# Patient Record
Sex: Female | Born: 1965 | Race: White | Marital: Married | Smoking: Never smoker
Health system: Northeastern US, Academic
[De-identification: ages and names within clinical notes are randomized; demographics above are authoritative.]

## PROBLEM LIST (undated history)

## (undated) DIAGNOSIS — R7989 Other specified abnormal findings of blood chemistry: Secondary | ICD-10-CM

## (undated) DIAGNOSIS — I7300 Raynaud's syndrome without gangrene: Secondary | ICD-10-CM

## (undated) DIAGNOSIS — J309 Allergic rhinitis, unspecified: Secondary | ICD-10-CM

## (undated) HISTORY — DX: Allergic rhinitis, unspecified: J30.9

## (undated) HISTORY — DX: Other specified abnormal findings of blood chemistry: R79.89

---

## 2007-10-07 DIAGNOSIS — R635 Abnormal weight gain: Secondary | ICD-10-CM | POA: Insufficient documentation

## 2007-10-07 DIAGNOSIS — J302 Other seasonal allergic rhinitis: Secondary | ICD-10-CM | POA: Insufficient documentation

## 2008-08-31 DIAGNOSIS — J309 Allergic rhinitis, unspecified: Secondary | ICD-10-CM | POA: Insufficient documentation

## 2008-09-21 DIAGNOSIS — J3089 Other allergic rhinitis: Secondary | ICD-10-CM | POA: Insufficient documentation

## 2009-06-02 ENCOUNTER — Ambulatory Visit
Admit: 2009-06-02 | Discharge: 2009-06-02 | Disposition: A | Discharge status: Home / Self Care | Payer: Self-pay | Source: Ambulatory Visit | Attending: Otolaryngology | Admitting: Otolaryngology

## 2009-06-16 ENCOUNTER — Ambulatory Visit
Admit: 2009-06-16 | Discharge: 2009-06-16 | Disposition: A | Discharge status: Home / Self Care | Payer: Self-pay | Source: Ambulatory Visit | Attending: Otolaryngology | Admitting: Otolaryngology

## 2009-06-28 ENCOUNTER — Ambulatory Visit: Payer: Self-pay | Admitting: Otolaryngology

## 2009-07-19 ENCOUNTER — Ambulatory Visit: Payer: Self-pay | Admitting: Otorhinolaryngology & Head-Neck

## 2009-08-09 ENCOUNTER — Ambulatory Visit: Payer: Self-pay

## 2009-08-23 ENCOUNTER — Ambulatory Visit: Payer: Self-pay

## 2009-09-16 ENCOUNTER — Ambulatory Visit: Payer: Self-pay

## 2009-09-27 ENCOUNTER — Ambulatory Visit: Payer: Self-pay

## 2009-10-11 ENCOUNTER — Ambulatory Visit: Payer: Self-pay

## 2009-10-25 ENCOUNTER — Ambulatory Visit: Payer: Self-pay

## 2009-11-08 ENCOUNTER — Encounter: Payer: Self-pay | Admitting: Gastroenterology

## 2009-11-08 ENCOUNTER — Ambulatory Visit: Payer: Self-pay

## 2009-11-29 ENCOUNTER — Ambulatory Visit: Payer: Self-pay

## 2009-12-27 ENCOUNTER — Ambulatory Visit: Payer: Self-pay

## 2010-01-09 ENCOUNTER — Ambulatory Visit: Payer: Self-pay

## 2010-01-24 ENCOUNTER — Ambulatory Visit: Payer: Self-pay

## 2010-02-07 ENCOUNTER — Ambulatory Visit: Payer: Self-pay

## 2010-02-28 ENCOUNTER — Ambulatory Visit: Payer: Self-pay

## 2010-03-21 ENCOUNTER — Ambulatory Visit: Payer: Self-pay

## 2010-03-21 LAB — HM MAMMOGRAPHY

## 2010-04-11 ENCOUNTER — Ambulatory Visit: Payer: Self-pay

## 2010-04-17 ENCOUNTER — Ambulatory Visit: Payer: Self-pay | Admitting: Primary Care

## 2010-04-17 NOTE — H&P (Addendum)
Reason For Visit   Diana Bell is a 44 year year old female here for a physical.  Patient is   currently taking allergy medications d/t seasonal allergies.  Medications   updated.  [email or web address].  Allergies   Latex-asked/denied  No Known Drug Allergy.  Current Meds   Nasonex 50 MCG/ACT Suspension;USE 2 SPRAYS IN EACH NOSTRIL ONCE DAILY; Rx  Singulair 10 MG Tablet;TAKE 1 TABLET DAILY.; Rx  Clarinex-D 24 Hour 5-240 MG Tablet Extended Release 24 Hour;TAKE 1 TABLET   DAILY.; Rx.  Active Problems   Mild Abnormal Weight Gain (783.1)  Allergic Rhinitis (477.9)  Allergic Rhinitis Due To Dust Mite (477.8)  Allergy To Cats (V15.09); MOLDS ALSO  Palpitations (785.1).  PMH   Entire omental allergies.  PSH   None.  Family Hx   Mother 57 years old, anxiety, HTN, carotid endarterectomy. Father 72 years   old glaucoma. Sister developmental disabled, seizure disorder, brother   arthritis.  Personal Hx   Married monogamous heterosexual no STD concerns.  No tobacco use.  One to 2   alcoholic beverages per week.  No illicit drug use.  Exercises regularly.    Just completed PhD studies.  Health Mgmt Plan   Mammogram every 1 year; for HEALTH MAINTENANCE.  Vital Signs   Recorded by Driscilla Grammes on 17 Apr 2010 02:10 PM  BP:114/72,  RUE,  Sitting,   HR: 77 b/min,   Height: 66 in, Weight: 163 lb, BMI: 26.3 kg/m2.  Physical Exam       REVIEW OF SYSTEMS:  General: No fatigue.  No significant weight change.  Head/ears/eyes/throat/nose: No hearing loss.  No vision change.  No oral   sores.  Respiratory: No shortness of breath or cough  Cardiovascular: No palpitations, angina or syncope  Gastrointestinal: No abdominal pain or bloating, change in stool color or   caliber; no hematemesis, hematochezia; no chronic diarrhea or constipation;   no dysphagia.  Genitourinary: No dysuria, frequency, hesitancy or nocturia  Endocrine: No polyuria, polydipsia, no thyroid disease.  Hematologic: No epistaxis, easy bruising or lymphadenopathy  Integumentary System: No  skin rashes or lesions  Neurologic: No progressive headache; no numbness or weakness to   extremities.  No history of seizures.  Infectious disease: No history of hepatitis, TB. Tested for TB yearly at   workplace with negative testing Has had  MMR and chicken pox  Musculoskeletal: No joint symptoms     OBJECTIVE:   GENERAL: Well-developed well-nourished in no distress.  SKIN: No rashes or abnormal lesions seen.  HEENT: PERRLA.  EOMI.  External auditory canals clear of cerumen.  Tympanic   membranes pearly gray, mobile without scarring or lesion.  Nares patent   bilaterally.  Oral mucosa moist without lesion.  Dentition unremarkable.    Pharynx without lesion.  NECK: Supple, no palpable lymphadenopathy, no thyromegaly.  No JVD, no   carotid bruits.  BREAST: No masses dimpling or discharge.  CHEST: Normal configuration.  LUNGS: Good air entry bilaterally, clear to auscultation bilaterally.  No   rales, rhonchi or wheeze.  HEART: Regular rate, normal S1, S2, no murmur.  ABDOMEN: No HSM, no masses, no hernias, nontender.  Abdominal aorta not   palpably enlarged.  The patient had normal active bowel sounds.  PELVIC: Deferred to GYN visit.  RECTAL: Deferred to GYN visit  EXTREMITIES: No edema, 2+ pedal pulses.  MUSCULOSKELETAL: Normal passive range of motion of shoulders, hips and   knees.  NEUROLOGIC: No tremor or strength deficit.  Sensory and motor  intact.    Cranial nerves normal.     ASSESSMENT/PLAN:  1. environmental allergies with seasonal flares.  Continue with year round   allergy injections as well as medications which have symptoms under   excellent control.  Patient Profile   No legal documents on file for health care management  RHIO and health care   proxy not in chart.  Native language is unknown  OTHER.  Native language   English.  No physical disability was observed.  No visual impairment.  No hearing loss.  Prev medicine administration of health risk questionnaire  ANNUAL RISK;    Counseling about  end-of-life issues  MOLST  Assessment   Discussed/Recommended:    Cardiovascular disease risk factors and risk factor modification.   Nutrition/weight management   Regular aerobic exercise   Monthly self breast exams and periodic mammograms   Annual gynecologic exams   Skin cancer screen/sun protection and avoidance   Colon cancer screening, recommendations for colonoscopy reviewed    Osteoporosis prevention, dietary calcium and vitamin D. requirements   reviewed.   Sexuality and STD risk factors reviewed   Safety and use of appropriate protective gear reviewed   Health-care proxy discussed.Encouraged patient to bring in updated form.   Age appropriate immunizations reviewed.  Signature   Electronically signed by: Erskine Speed  D.O.; 04/17/2010 4:54 PM EST.  Electronically signed by: Erskine Speed  D.O.; 04/17/2010 4:54 PM EST.

## 2010-04-19 ENCOUNTER — Ambulatory Visit
Admit: 2010-04-19 | Discharge: 2010-04-19 | Disposition: A | Discharge status: Home / Self Care | Payer: Self-pay | Source: Ambulatory Visit | Attending: Primary Care | Admitting: Primary Care

## 2010-04-19 LAB — CBC AND DIFFERENTIAL
Baso # K/uL: 0 THOU/uL (ref 0.0–0.1)
Basophil %: 0.2 % (ref 0.1–1.2)
Eos # K/uL: 0 THOU/uL (ref 0.0–0.4)
Eosinophil %: 0.6 % — ABNORMAL LOW (ref 0.7–5.8)
Hematocrit: 42 % (ref 34–45)
Hemoglobin: 13.4 g/dL (ref 11.2–15.7)
Lymph # K/uL: 1.9 THOU/uL (ref 1.2–3.7)
Lymphocyte %: 35.5 % (ref 19.3–51.7)
MCV: 87 fL (ref 79–95)
Mono # K/uL: 0.3 THOU/uL (ref 0.2–0.9)
Monocyte %: 6.3 % (ref 4.7–12.5)
Neut # K/uL: 3 THOU/uL (ref 1.6–6.1)
Platelets: 229 THOU/uL (ref 160–370)
RBC: 4.9 MIL/uL (ref 3.9–5.2)
RDW: 13 % (ref 11.7–14.4)
Seg Neut %: 57.4 % (ref 34.0–71.1)
WBC: 5.2 THOU/uL (ref 4.0–10.0)

## 2010-04-19 LAB — LIPID PANEL
Chol/HDL Ratio: 2.6
Cholesterol: 164 mg/dL
HDL: 64 mg/dL
LDL Calculated: 89 mg/dL
Non HDL Cholesterol: 100 mg/dL
Triglycerides: 57 mg/dL

## 2010-04-19 LAB — BASIC METABOLIC PANEL
Anion Gap: 8 (ref 7–16)
CO2: 28 mmol/L (ref 20–28)
Calcium: 9 mg/dL (ref 8.6–10.2)
Chloride: 101 mmol/L (ref 96–108)
Creatinine: 0.89 mg/dL (ref 0.51–0.95)
GFR,Black: >59 *
GFR,Caucasian: >59 *
Glucose: 80 mg/dL (ref 74–106)
Lab: 16 mg/dL (ref 6–20)
Potassium: 4.1 mmol/L (ref 3.3–5.1)
Sodium: 137 mmol/L (ref 133–145)

## 2010-04-19 LAB — TSH: TSH: 2.76 u[IU]/mL (ref 0.27–4.20)

## 2010-04-20 LAB — MUMPS ANTIBODY, IGG: Mumps IgG: IMMUNE

## 2010-04-20 LAB — RUBELLA ANTIBODY, IGG: Rubella IgG AB: IMMUNE

## 2010-04-20 LAB — MEASLES IGG AB: Measles IgG: IMMUNE

## 2010-04-21 LAB — VARICELLA ZOSTER IGG AB

## 2010-05-05 ENCOUNTER — Ambulatory Visit: Payer: Self-pay

## 2010-05-23 ENCOUNTER — Ambulatory Visit: Payer: Self-pay

## 2010-06-02 ENCOUNTER — Ambulatory Visit: Payer: Self-pay | Admitting: Otolaryngology

## 2010-06-02 NOTE — Progress Notes (Signed)
 Otolaryngology   Dear Dr. Erskine Speed:     We had the pleasure of seeing your patient, Diana Bell, in the   Otolaryngology  Clinic on June 02, 2010.  She is a 44 year old female   being seen in follow up for annual allergy evaluation.     Patient began allergy immunotherapy in September 2009 for perennial and   seasonal allergic rhinitis.  She has had a significant improvement in her   symptomatology and is very pleased.  She is obtaining injections every two   to 3 weeks.  She uses Clarinex D., Nasonex, and Singulair intermittently   during her pollen season.  Patient denies asthma or eczema.     MEDS: electronic medication record was reviewed and updated.  Pertinent   medications include Clarinex D., Nasonex, Singulair     On Exam patient is a healthy-appearing 44 year old female  EARS:external auditory canal patent bilaterally.  Both tympanic membranes   are intact and mobile.  NOSE: Erythematous mucous membranes, mild right deviated septum  ORAL: Posterior pharyngeal wall without inflammation  NECK: No adenopathy  LUNGS: Clear to auscultation.  SKIN: Without rash     My impression is that the patient has excellent control of her allergies on   allergy immunotherapy and supplemental medications.  We will continue with   the present regimen.  Follow up in 18 months for repeat examination.     Sincerely,     Diana Bell, M.D.  dictated by voice recognition software.  Signature   Electronically signed by: Salvadore Farber  MD Attend.; 06/02/2010 1:23 PM EST.

## 2010-06-13 ENCOUNTER — Ambulatory Visit: Payer: Self-pay

## 2010-07-04 ENCOUNTER — Ambulatory Visit: Payer: Self-pay

## 2010-07-25 ENCOUNTER — Ambulatory Visit: Payer: Self-pay

## 2010-07-25 ENCOUNTER — Ambulatory Visit
Admit: 2010-07-25 | Discharge: 2010-07-25 | Disposition: A | Discharge status: Home / Self Care | Payer: Self-pay | Source: Ambulatory Visit | Attending: Primary Care | Admitting: Primary Care

## 2010-07-25 LAB — BASIC METABOLIC PANEL
Anion Gap: 11 (ref 7–16)
CO2: 25 mmol/L (ref 20–28)
Calcium: 9 mg/dL (ref 8.6–10.2)
Chloride: 103 mmol/L (ref 96–108)
Creatinine: 0.85 mg/dL (ref 0.51–0.95)
GFR,Black: >59 *
GFR,Caucasian: >59 *
Glucose: 88 mg/dL (ref 74–106)
Lab: 18 mg/dL (ref 6–20)
Potassium: 4.2 mmol/L (ref 3.3–5.1)
Sodium: 139 mmol/L (ref 133–145)

## 2010-07-25 LAB — LIPID PANEL
Chol/HDL Ratio: 3.2
Cholesterol: 188 mg/dL
HDL: 58 mg/dL
LDL Calculated: 115 mg/dL
Non HDL Cholesterol: 130 mg/dL
Triglycerides: 74 mg/dL

## 2010-07-25 LAB — HEPATIC FUNCTION PANEL
ALT: 64 U/L — ABNORMAL HIGH (ref 0–35)
AST: 44 U/L — ABNORMAL HIGH (ref 0–35)
Albumin: 4.3 g/dL (ref 3.5–5.2)
Alk Phos: 65 U/L (ref 35–105)
Bilirubin,Direct: <0.2 mg/dL (ref 0.0–0.3)
Bilirubin,Total: 0.5 mg/dL (ref 0.0–1.2)
Total Protein: 6.7 g/dL (ref 6.3–7.7)

## 2010-07-26 LAB — HEMOGLOBIN A1C: Hemoglobin A1C: 5.3 % (ref 4.0–6.0)

## 2010-07-27 LAB — HEPATITIS A,B,C PROF
HBV Core Ab: NEGATIVE
HBV S Ab Quant: 22.8 IU/L
HBV S Ab: POSITIVE
HBV S Ag: NEGATIVE
Hep A Total Ab: NEGATIVE
Hep C Ab: NEGATIVE

## 2010-07-31 ENCOUNTER — Other Ambulatory Visit: Payer: Self-pay | Admitting: Primary Care

## 2010-08-16 ENCOUNTER — Ambulatory Visit: Payer: Self-pay | Admitting: Primary Care

## 2010-08-16 LAB — HEPATIC FUNCTION PANEL
ALT: 44 U/L — ABNORMAL HIGH (ref 0–35)
AST: 36 U/L — ABNORMAL HIGH (ref 0–35)
Albumin: 4.3 g/dL (ref 3.5–5.2)
Alk Phos: 65 U/L (ref 35–105)
Bilirubin,Direct: 0.1 mg/dL (ref 0.0–0.3)
Bilirubin,Total: 0.3 mg/dL (ref 0.0–1.2)
Total Protein: 6.9 g/dL (ref 6.3–7.7)

## 2010-08-16 LAB — FERRITIN: Ferritin: 5 ng/mL — ABNORMAL LOW (ref 10–120)

## 2010-08-16 NOTE — Progress Notes (Signed)
 Reason For Visit   F/u abdominal US  [email or web address].  HPI   Patient was noted to have elevated liver function tests on recent lab work.     She notes at the time she was using Advil and acetaminophen for aches and   pains.     Darl Pikes denies any knowledge of family history of liver disease.  She has no   personal history of hepatitis or yellow jaundice.     She also denied any alcohol intake other than one to 2 drinks per month.     She feels well and denies itching fatigue unusual bleeding or bruising.     We reviewed ultrasound results which were normal.  Allergies   Latex-asked/denied  No Known Drug Allergy.  Current Meds   Clarinex-D 24 Hour 5-240 MG Tablet Extended Release 24 Hour;TAKE 1 TABLET   DAILY.; Rx  Singulair 10 MG Tablet;TAKE 1 TABLET DAILY.; Rx  Nasonex 50 MCG/ACT Suspension;USE 2 SPRAYS IN EACH NOSTRIL ONCE DAILY; Rx.  Active Problems   Mild Abnormal Weight Gain (783.1)  Allergic Rhinitis (477.9)  Allergic Rhinitis Due To Dust Mite (477.8)  Allergy To Cats (V15.09); MOLDS ALSO  Palpitations (785.1).  Vital Signs   Recorded by MARTINEZ,JUDIE on 16 Aug 2010 07:44 AM  BP:118/78,  RUE,  Sitting,   HR: 60 b/min,  R Radial, Regular,   Height: 67 in, Weight: 160 lb, BMI: 25.1 kg/m2.  Physical Exam   Well-nourished well-developed no acute distress.  Eyes are anicteric.  Neck   is supple.  Lungs are clear without wheezing.  Heart regular rate and   rhythm no murmur.  Her abdomen is soft nontender with normoactive bowel   sounds.  No hepatosplenomegaly.  Extremities are negative for edema.     Assessment and plan 1.  Mild elevation of liver function tests of unclear   etiology.  Will repeat blood work checking autoimmune labs to include ANA,   ANCA, smooth muscle antibody, the f Actin antibody, and ferritin levels.     Followup in 3-6 months depending on results. Continue to avoid tylenol and   alcohol.  Signature   Electronically signed by: Erskine Speed  D.O.; 08/16/2010 4:45 PM EST.

## 2010-08-17 LAB — MITOCHONDRIAL ANTIBODIES, M2: Mitochondrial Ab: 8.1 U (ref 0.0–20.0)

## 2010-08-17 LAB — ANA TITER/PATTERN: ANA Titer: 80 — AB

## 2010-08-17 LAB — F-ACTIN ANTIBODY: F-Actin IgG: 9 U (ref 0–19)

## 2010-08-17 LAB — ANCA SCREEN: ANCA Screen: NEGATIVE

## 2010-08-17 LAB — ANTINUCLEAR ANTIBODY SCREEN: ANA Screen: POSITIVE — AB

## 2010-08-22 ENCOUNTER — Ambulatory Visit: Payer: Self-pay

## 2010-08-27 NOTE — Miscellaneous (Unsigned)
 Continuity of Care Record  Created: todo  From: [facility], SUSAN  From:   From: [employer], EHR v10.2.7.53  To: Diana Bell  Purpose: Patient Use;       Problems  Diagnosis: Mild Abnormal Weight Gain (783.1)   Diagnosis: Allergic Rhinitis (477.9)   Diagnosis: Allergic Rhinitis Due To Dust Mite (477.8)   Diagnosis: Allergy To Cats (V15.09)   Diagnosis: Palpitations (785.1)     Alerts  Allergy - Latex-asked/denied   Allergy - No Known Drug Allergy     Medications  Clarinex-D 24 Hour 5-240 MG Tablet Extended Release 24 Hour; TAKE 1 TABLET   DAILY. ; Rx   Nasonex 50 MCG/ACT Suspension; USE 2 SPRAYS IN EACH NOSTRIL ONCE DAILY ; Rx   Singulair 10 MG Tablet; TAKE 1 TABLET DAILY. ; Rx     Immunizations  * Varicella   Td   MMR   Varicella

## 2010-09-15 ENCOUNTER — Ambulatory Visit: Payer: Self-pay

## 2010-10-17 ENCOUNTER — Ambulatory Visit: Payer: Self-pay

## 2010-11-07 ENCOUNTER — Ambulatory Visit: Payer: Self-pay

## 2010-11-20 ENCOUNTER — Ambulatory Visit
Admit: 2010-11-20 | Discharge: 2010-11-20 | Disposition: A | Discharge status: Home / Self Care | Payer: Self-pay | Source: Ambulatory Visit | Attending: Primary Care | Admitting: Primary Care

## 2010-11-20 LAB — HEPATIC FUNCTION PANEL
ALT: 46 U/L — ABNORMAL HIGH (ref 0–35)
AST: 27 U/L (ref 0–35)
Albumin: 4.3 g/dL (ref 3.5–5.2)
Alk Phos: 67 U/L (ref 35–105)
Bilirubin,Direct: <0.2 mg/dL (ref 0.0–0.3)
Bilirubin,Total: 0.4 mg/dL (ref 0.0–1.2)
Total Protein: 6.7 g/dL (ref 6.3–7.7)

## 2010-12-01 ENCOUNTER — Ambulatory Visit: Payer: Self-pay

## 2010-12-26 ENCOUNTER — Ambulatory Visit: Payer: Self-pay

## 2011-01-19 ENCOUNTER — Ambulatory Visit: Payer: Self-pay

## 2011-01-19 NOTE — Progress Notes (Signed)
 Injection Date: 01/19/2011  Pre Assessment:no problems  Dose Given:0.15cc A right arm,  0.25cc B left arm  Post-Assessment:no problems  Comment:              Alben Spittle [facility]

## 2011-01-24 ENCOUNTER — Encounter: Payer: Self-pay | Admitting: Gastroenterology

## 2011-01-26 ENCOUNTER — Encounter: Payer: Self-pay | Admitting: Primary Care

## 2011-01-26 ENCOUNTER — Telehealth: Payer: Self-pay | Admitting: Primary Care

## 2011-01-26 DIAGNOSIS — R7989 Other specified abnormal findings of blood chemistry: Secondary | ICD-10-CM

## 2011-01-26 NOTE — Telephone Encounter (Signed)
Left message that labs have been ordered for her.

## 2011-01-26 NOTE — Telephone Encounter (Signed)
 Labs ordered per e message request

## 2011-01-29 ENCOUNTER — Ambulatory Visit
Admit: 2011-01-29 | Discharge: 2011-01-29 | Disposition: A | Discharge status: Home / Self Care | Payer: Self-pay | Source: Ambulatory Visit | Attending: Primary Care | Admitting: Primary Care

## 2011-01-29 DIAGNOSIS — R7989 Other specified abnormal findings of blood chemistry: Secondary | ICD-10-CM

## 2011-01-29 LAB — HEPATIC FUNCTION PANEL
ALT: 53 U/L — ABNORMAL HIGH (ref 0–35)
AST: 31 U/L (ref 0–35)
Albumin: 4.2 g/dL (ref 3.5–5.2)
Alk Phos: 70 U/L (ref 35–105)
Bilirubin,Direct: <0.2 mg/dL (ref 0.0–0.3)
Bilirubin,Total: 0.3 mg/dL (ref 0.0–1.2)
Total Protein: 6.7 g/dL (ref 6.3–7.7)

## 2011-01-30 LAB — ANTINUCLEAR ANTIBODY SCREEN: ANA Screen: POSITIVE — AB

## 2011-01-30 LAB — ANA TITER/PATTERN: ANA Titer: 80 — AB

## 2011-01-31 ENCOUNTER — Telehealth: Payer: Self-pay | Admitting: Primary Care

## 2011-01-31 NOTE — Telephone Encounter (Signed)
Left message to call on "other" number.  Home number was fast busy signal.

## 2011-01-31 NOTE — Telephone Encounter (Signed)
Message copied by Adonis Brook on Wed Jan 31, 2011 12:03 PM  ------       Message from: Collier Flowers       Created: Tue Jan 30, 2011  6:37 PM         Labs continued to show mild elevation in liver function test and ANA level.  Will discuss in further detail at followup next week

## 2011-02-01 NOTE — Telephone Encounter (Signed)
Left message to call office back

## 2011-02-05 ENCOUNTER — Ambulatory Visit: Payer: Self-pay | Admitting: Primary Care

## 2011-02-05 VITALS — BP 124/78 | HR 71 | Temp 98.6°F | Resp 17 | Ht 66.0 in | Wt 167.6 lb

## 2011-02-05 DIAGNOSIS — R945 Abnormal results of liver function studies: Secondary | ICD-10-CM

## 2011-02-05 DIAGNOSIS — R7989 Other specified abnormal findings of blood chemistry: Secondary | ICD-10-CM

## 2011-02-05 NOTE — Progress Notes (Signed)
Diana Bell reports feeling well.  She has no chest pain palpitation or shortness of breath.  Is here in followup of her elevated liver function tests.  She has very little alcohol intake one to 2 drinks per week.  She's limiting fat and cholesterol in her diet.  She also does not use Tylenol on a persistent basis.  Energy level is normal.  She has no abnormal bleeding or bruising.    There is no family or personal history of liver disease.  She has no risk factors such as blood transfusion or high-risk behavior for hepatitis.  There is no history of hepatitis.    On physical exam she is well-nourished well-developed no acute distress.  Eyes are anicteric.  Neck is supple.  Lungs clear to auscultation.  Heart regular rate and rhythm no murmur.  Abdomen soft nontender with normoactive bowel sounds.  Extremities are negative for edema.    Assessment and plan 1.  Mild elevation in liver function tests.  She was offered consultation with GI specialist but declines this.  2 continue with diet exercise lack of alcohol and acetaminophen and check liver function tests orderly for one year and then every 6 months if stable.

## 2011-02-20 ENCOUNTER — Ambulatory Visit: Payer: Self-pay

## 2011-02-20 NOTE — Progress Notes (Signed)
Injection Date: 02/20/2011  Pre Assessment:doing very well, not requiring any meds  Dose Given:0.25cc A right arm,  0.25cc B left arm  Post-Assessment:no problems  Comment:              Diana Bell Kayvon Mo

## 2011-03-16 ENCOUNTER — Ambulatory Visit: Payer: Self-pay

## 2011-03-16 NOTE — Progress Notes (Signed)
Injection Date: 03/16/2011  Pre Assessment: virtually no allergy symptoms  Dose Given: 0.25 cc A right upper arm; 0.25 cc B left upper arm  Post-Assessment: no reaction  Comment:              Diana Bell

## 2011-03-19 ENCOUNTER — Ambulatory Visit: Payer: Self-pay | Admitting: Physician Assistant

## 2011-03-19 MED ORDER — PREDNISONE 20 MG PO TABS *I*
ORAL_TABLET | ORAL | Status: DC
Start: 2011-03-19 — End: 2011-05-29

## 2011-03-19 NOTE — Progress Notes (Signed)
Patient is a 45 year old female reports yesterday which is complaining of rash on her bilateral shins.  She states has been present since Thursday.  She denies being in any wooded areas, denies any new soaps or detergents denies being bit by any insects, denies being any hot tubs or tanning booths.  She reports a very mild itch but does complain of a mild burning.  She has used hydrocortisone cream with no significant relief.  She denies any shortness of breath tongue swelling or trouble swallowing.  She does report she has been wearing more pants have been taken to the dry cleaners and believes this may have caused it    Physical exam  Dermatologic patient has erythematous papules on bilateral lower shins.  There is no streaking no pus no drainage no vesicles.    Assessment and plan :allergic dermatitis: start on prednisone and Zyrtec if no improvement by Friday we'll refer her to dermatology.  I did educate her on signs of cellulitis to look for she is to call if she gets any of these symptoms

## 2011-04-10 ENCOUNTER — Ambulatory Visit: Payer: Self-pay

## 2011-04-10 NOTE — Progress Notes (Signed)
ALLERGY SERUM ADMINISTRATION Date: 04/10/2011    Pre -Injection Assessment  allergy symptoms well controlled  Administration  Serum A 0.25 cc  proximal  right upper arm  Serum B 0.25cc left arm  Post-Injection Assessment  Serum A and  B  Without problems    Alben Spittle [facility]

## 2011-05-17 ENCOUNTER — Ambulatory Visit: Payer: Self-pay

## 2011-05-17 NOTE — Progress Notes (Signed)
ALLERGY SERUM ADMINISTRATION Date: 05/17/2011    Pre -Injection Assessment  allergy symptoms well controlled  Administration  Serum A 0.15cc  proximal  right upper arm  Serum B 0.15cc  proximal  left upper arm  Post-Injection Assessment  No problems    Alben Spittle [facility]

## 2011-05-29 ENCOUNTER — Ambulatory Visit: Payer: Self-pay | Admitting: Otolaryngology

## 2011-05-29 ENCOUNTER — Encounter: Payer: Self-pay | Admitting: Otolaryngology

## 2011-05-29 VITALS — BP 113/66 | HR 72 | Ht 67.0 in | Wt 171.2 lb

## 2011-05-29 DIAGNOSIS — J309 Allergic rhinitis, unspecified: Secondary | ICD-10-CM

## 2011-05-29 NOTE — Progress Notes (Signed)
DEPARTMENT OF OTOLARYNGOLOGY    Today's Date: 05/29/2011     Chief Complaint: Allergy recheck    HPI:  Diana Bell is a 45 y.o. year old female seen in follow up for perennial and seasonal allergic rhinitis.  Patient has been on allergy immunotherapy since the fall 2009.  Original symptoms included headache and facial pressure and occasional postnasal drainage.  Patient reports significant improvement in her symptomatology.  She uses Clarinex on an as-needed basis.  She is presently obtaining injections once a month with minimal symptomatology.  Even during her peak season in the spring she has minimal symptoms.                     ROS:  Denies asthma, eczema, food or chemical sensitivities    Allergies:   Environmental allergies    Active Medications:  Current Outpatient Prescriptions   Medication   . desloratadine-pseudoephedrine (CLARINEX-D 24 HOUR) 5-240 MG per 24 hr tablet       Electronic record reviewed for PMH, PSH    Physical Examination:  Healthy appearing 45 y.o. year old female  Blood pressure 113/66, pulse 72, height 1.702 m (5\' 7" ), weight 77.656 kg (171 lb 3.2 oz).  EARS:external auditory canal patent bilaterally.  Both tympanic membranes are intact and mobile.  NOSE: Right anterior deviated septum, mildly dry mucous membranes  ORAL: Posterior pharyngeal wall without abnormality  LARYNX: Not visualized  NECK: No adenopathy  SKIN: Without rash         Assessment:  45 year old female with perennial and seasonal allergic rhinitis.  Symptoms are well-controlled on allergy immunotherapy and Clarinex D. as needed.  Patient will continue with present regimen.  Followup in one year for routine check.        Maurilio Lovely, MD as of 8:35 AM, 05/29/2011

## 2011-06-05 ENCOUNTER — Ambulatory Visit: Payer: Self-pay

## 2011-06-05 NOTE — Progress Notes (Signed)
ALLERGY SERUM ADMINISTRATION Date: 06/05/2011    Pre -Injection Assessment  mild increase in symptoms this week  Administration  Serum A 0.25 cc  proximal  right upper arm  Serum B 0.25 cc  proximal  left upper arm  Post-Injection Assessment  No problems      Alben Spittle [facility]

## 2011-07-03 ENCOUNTER — Ambulatory Visit: Payer: Self-pay

## 2011-07-03 NOTE — Progress Notes (Signed)
ALLERGY SERUM ADMINISTRATION Date: 07/03/2011    Pre -Injection Assessment  allergy symptoms controlled with medication  Administration  Serum A 0.25 cc  proximal  right upper arm  Serum B 0.25 cc  proximal  left upper arm  Post-Injection Assessment  No problems      Alben Spittle [facility]

## 2011-07-26 ENCOUNTER — Ambulatory Visit: Payer: Self-pay

## 2011-08-01 ENCOUNTER — Ambulatory Visit
Admit: 2011-08-01 | Discharge: 2011-08-01 | Disposition: A | Discharge status: Home / Self Care | Payer: Self-pay | Source: Ambulatory Visit | Attending: Primary Care | Admitting: Primary Care

## 2011-08-01 DIAGNOSIS — R7989 Other specified abnormal findings of blood chemistry: Secondary | ICD-10-CM

## 2011-08-01 LAB — HEPATIC FUNCTION PANEL
ALT: 76 U/L — ABNORMAL HIGH (ref 0–35)
AST: 50 U/L — ABNORMAL HIGH (ref 0–35)
Albumin: 4.3 g/dL (ref 3.5–5.2)
Alk Phos: 68 U/L (ref 35–105)
Bilirubin,Direct: <0.2 mg/dL (ref 0.0–0.3)
Bilirubin,Total: 0.5 mg/dL (ref 0.0–1.2)
Total Protein: 6.7 g/dL (ref 6.3–7.7)

## 2011-08-03 ENCOUNTER — Encounter: Payer: Self-pay | Admitting: Primary Care

## 2011-08-06 ENCOUNTER — Ambulatory Visit: Payer: Self-pay | Admitting: Primary Care

## 2011-08-06 ENCOUNTER — Encounter: Payer: Self-pay | Admitting: Primary Care

## 2011-08-06 ENCOUNTER — Encounter: Payer: Self-pay | Admitting: Gastroenterology

## 2011-08-06 VITALS — BP 114/66 | HR 80 | Ht 67.0 in | Wt 171.0 lb

## 2011-08-06 DIAGNOSIS — R7989 Other specified abnormal findings of blood chemistry: Secondary | ICD-10-CM

## 2011-08-06 DIAGNOSIS — J309 Allergic rhinitis, unspecified: Secondary | ICD-10-CM

## 2011-08-17 ENCOUNTER — Ambulatory Visit: Payer: Self-pay

## 2011-09-07 ENCOUNTER — Ambulatory Visit: Payer: Self-pay

## 2011-09-12 ENCOUNTER — Encounter: Payer: Self-pay | Admitting: Radiology

## 2011-09-18 ENCOUNTER — Ambulatory Visit: Payer: Self-pay | Admitting: Gastroenterology

## 2011-10-18 ENCOUNTER — Ambulatory Visit: Payer: Self-pay

## 2011-10-18 NOTE — Progress Notes (Signed)
ALLERGY SERUM ADMINISTRATION Date: 10/18/2011    Pre -Injection Assessment  allergy symptoms controlled with medication  Administration  Serum A 0.15cc  proximal  right upper arm  Serum B 0.15cc  proximal  left upper arm  Post-Injection Assessment  New vials    Alben Spittle [facility]

## 2011-10-23 ENCOUNTER — Ambulatory Visit
Admit: 2011-10-23 | Discharge: 2011-10-23 | Disposition: A | Discharge status: Home / Self Care | Payer: Self-pay | Attending: Gastroenterology | Admitting: Gastroenterology

## 2011-10-23 ENCOUNTER — Encounter: Payer: Self-pay | Admitting: Gastroenterology

## 2011-10-23 VITALS — BP 107/47 | HR 64 | Resp 18 | Ht 67.0 in | Wt 177.4 lb

## 2011-10-23 DIAGNOSIS — R7989 Other specified abnormal findings of blood chemistry: Secondary | ICD-10-CM

## 2011-10-23 HISTORY — DX: Raynaud's syndrome without gangrene: I73.00

## 2011-10-23 NOTE — H&P (Addendum)
Lorilynn Cata  [id number]     10/23/2011  Collier Flowers, DO  [address]  [place], [place] [address]      We had the pleasure of seeing your patient, Diana Bell, in the outpatient gastroenterology/hepatology clinic. As you know, she is a 46 y.o. female with no significant past medical history who is referred to our office for further evaluation of abnormal LFTs. She believes she has Raynaud's disease.     Abnormal LFTs were initially detected in 2011. There are no prior labs for comparison. Since 2011, they have been persistently elevated, up to 2x the upper limit of normal. She denies any risk factors for viral hepatitis; Hepatitis A, B, and C were negative in December 2011. She has been vaccinated for hepatitis B. ANA was positive, 1:80. F-Actin, Mitochondrial Ab and ANCA are WNL. Abdominal ultrasound in December 2011 was unremarkable.     She denies any recent medication changes. She was previously taking OTC anti-histamines for seasonal allergies which she has been avoiding. She continues to receive allergy shots. She drinks 2 glasses of wine per week maximum. No history of recreational drug use.     Allergies/Sensitivities:  Allergies   Allergen Reactions    Environmental Allergies     No Known Drug Allergy     No Known Latex Allergy        Medications:   Medication Sig   Evening Primrose Oil CAPS Take by mouth daily     Allergy shots                                          Twice a month.    Past Medical Hx:   Past Medical History   Diagnosis Date    Allergic rhinitis     Elevated LFTs     Raynaud disease        Past Surgical Hx: History reviewed. No pertinent past surgical history.    Social Hx:   History   Substance Use Topics    Smoking status: Never Smoker     Smokeless tobacco: Never Used    Alcohol Use: 1.2 oz/week     2 Glasses of wine per week   She is a physical therapist.     Family Hx:   Family History   Problem Relation Age of Onset    Heart disease Mother     Hypertension Mother      Anesth problems Mother     Cancer Father     Colon cancer Paternal Grandmother      70's       ROS:   General:  No malaise, fatigue, fever, chills, sweats.  HEENT:  No tinnitus, coryza, diplopia, dysgeusia.  Cardiovascular:  No chest pain, palpitations.  Respiratory:  No dyspnea, cough.  Musculoskeletal:  No myalgias.  GI:  See above.  GU:  No dysuria, hematuria.  Skin:  No rash, pruritus, jaundice.  Neuro:   No focal numbness, weakness, tremor.  Psychiatric:  No somnolence, confusion, dysphoria.  Endocrine:  No heat or cold intolerance.  Heme/Lymph:  No easy bruising/bleeding.  No concerning lumps.  Allergy/Rheum:  No arthralgias/arthritis.  No rash/hives.     Vitals:   Filed Vitals:    10/23/11 0959   BP: 107/47   Pulse: 64   Resp: 18   Height: 170.2 cm (5\' 7" )   Weight: 80.468  kg (177 lb 6.4 oz)      Body mass index is 27.78 kg/(m^2).    Physical Exam:   General: Well-appearing female in NAD. Comfortable.   Skin:  No rashes, jaundice, spider angiomata, palmar erythema, or telangiectasias.  Warm and dry.   Lymphatics:  No peripheral adenopathy palpated in SCF or cervical chains.    HEENT:  NC/AT, no glossitis, cheilosis, or icterus.  No oropharyngeal abnormalities.    Neck:  No masses or tracheal deviation.  Supple.  Lungs:  Clear bilaterally to auscultation. Normal respiratory effort.    Cor:  RRR without murmur, rub, or gallop.  No heave or thrill.    Abdomen:  Normal bowel sounds, no bruits.  Non-tender, non-distended. No hepatosplenomegaly, masses, hernias, or fluid wave/shifting dullness.    Rectum: Deferred, as it was not pertinent to this evaluation.   Extremities:  Warm, normal pedal pulses, good capillary refill of nail beds, no edema.  Neuro:  Alert and oriented x 3 with appropriate affect.  Ambulatory.  No gross motor deficits noted.      Labs/Imaging:      Ref. Range 07/25/2010 08:10 08/16/2010 08:41 11/20/2010 08:50 01/29/2011 08:33 08/01/2011 08:20   ALT Latest Range: 0-35 U/L 64 (H) 44 (H) 46 (H)  53 (H) 76 (H)   AST Latest Range: 0-35 U/L 44 (H) 36 (H) 27 31 50 (H)   Alk Phos Latest Range: 35-105 U/L 65 65 67 70 68   Bilirubin,Direct Latest Range: 0.0-0.3 mg/dL < 0.2 0.1 < 0.2 < 0.2 < 0.2   Bilirubin,Total Latest Range: 0.0-1.2 mg/dL 0.5 0.3 0.4 0.3 0.5      08/16/2010 08:41 01/29/2011 08:33   ANA Screen POS (A) POS (A)   ANA Pattern NUCLEOLAR NUCLEOLAR   ANA Titer 80 (A) 80 (A)   ANCA Screen NEG    F-Actin IgG 9    Mitochondrial Ab 8.1       07/25/2010 08:10   Hep A Total Ab NEG   HBV S Ag NEG   HBV S Ab POS   HBV S Ab Quant 22.80   HBV Core Ab NEG   HBV Interp see below   Hep C Ab NEG       Impression(s)/Recommendation(s):   46 yo female with no significant PMH who presents for consultation regarding abnormal LFTs. This was initially detected in December 2011. We do not have LFTs prior to this. She denies any recent medication changes or viral illnesses. The LFTs are mildly elevated with no signs/symptoms of decompensated liver disease.    1. Recommend additional blood work including ferritin, iron, TIBC, transferrin, ceruloplasmin, celiac screen.     2. Avoid hepatotoxic medications.     3. Encouraged healthy lifestyle with regular aerobic exercise and low fat diet.     4. Routine labs every 3 months to monitor LFTs.     4. Follow-up in 6 months.     We thank you for allowing [place] to participate in this patient's care.  The patient was seen, evaluated and plan determined by Dr. Marlow Baars. Please do not hesistate to contact us with any questions or concerns at [phone].      SARAH Gwynneth Macleod, PA      Administrative addendum:  The MLP is functioning as a scribe in typing this note for me (Dr. Domingo Dimes). I personally saw and examined this patient, formulated the assessment, and directed the management.  I also reviewed and edited this note before signature  The tests done including Celiac panel show no obvious cause for her liver enzyme elevations.  Her iron is low and oral supplementation can be  considered especially as she donates blood "every 56 days"  Likely it is mild fatty liver - diet/weight loss by 5-10 lb may be all it takes to normalize the liver tests - repeat in 3 months    Marlow Baars, MD

## 2011-10-23 NOTE — Discharge Instructions (Signed)
Please have blood work collected to further evaluate your liver function.

## 2011-10-24 ENCOUNTER — Ambulatory Visit
Admit: 2011-10-24 | Discharge: 2011-10-24 | Disposition: A | Discharge status: Home / Self Care | Payer: Self-pay | Source: Ambulatory Visit | Attending: Gastroenterology | Admitting: Gastroenterology

## 2011-10-24 LAB — HEPATIC FUNCTION PANEL
ALT: 62 U/L — ABNORMAL HIGH (ref 0–35)
AST: 40 U/L — ABNORMAL HIGH (ref 0–35)
Albumin: 3.9 g/dL (ref 3.5–5.2)
Alk Phos: 71 U/L (ref 35–105)
Bilirubin,Direct: <0.2 mg/dL (ref 0.0–0.3)
Bilirubin,Total: 0.4 mg/dL (ref 0.0–1.2)
Total Protein: 6.3 g/dL (ref 6.3–7.7)

## 2011-10-24 LAB — FERRITIN: Ferritin: 15 ng/mL (ref 10–120)

## 2011-10-24 LAB — GGT: GGT: 24 U/L (ref 5–36)

## 2011-10-24 LAB — TIBC
Iron: 67 ug/dL (ref 34–165)
TIBC: 370 ug/dL (ref 250–450)
Transferrin Saturation: 18 % (ref 15–50)

## 2011-10-24 LAB — CERULOPLASMIN: Ceruloplasmin: 27 mg/dL (ref 16–45)

## 2011-10-24 LAB — TRANSFERRIN: Transferrin: 310 mg/dL (ref 200–360)

## 2011-10-24 LAB — ALPHA-1-ANTITRYPSIN: A-1 Antitrypsin: 120 mg/dL (ref 90–200)

## 2011-10-25 ENCOUNTER — Telehealth: Payer: Self-pay | Admitting: Gastroenterology

## 2011-10-25 LAB — TISSUE TRANSGLUT,IGA
IgA: 274 mg/dL (ref 70–400)
tTG,IgA: 6.8 AB/Units (ref 0.0–19.9)

## 2011-10-25 NOTE — Telephone Encounter (Signed)
Called patient and left a message    Celaic disease test was negative  Iron is low  But other tets Ceruloplasmin, alpha 1 antitrypsin etc are all in the nromal range    Diet and lose 10 Lb repeat labs in 3 months

## 2011-11-08 ENCOUNTER — Ambulatory Visit: Payer: Self-pay

## 2011-11-08 NOTE — Progress Notes (Signed)
ALLERGY SERUM ADMINISTRATION Date: 11/08/2011    Pre -Injection Assessment  allergy symptoms controlled with medication  Administration  Serum A 0.25 cc  proximal  right upper arm  Serum B 0.25 cc  proximal  left upper arm  Post-Injection Assessment  No problems    Alben Spittle [facility]

## 2011-11-27 ENCOUNTER — Ambulatory Visit: Payer: Self-pay

## 2011-11-27 NOTE — Progress Notes (Signed)
ALLERGY SERUM ADMINISTRATION Date: 11/27/2011    Pre -Injection Assessment  allergy symptoms controlled with medication  Administration  Serum A 0.25 cc  proximal  right upper arm  Serum B 0.25 cc  proximal  left upper arm  Post-Injection Assessment  No problem    Alben Spittle [facility]

## 2011-12-11 ENCOUNTER — Ambulatory Visit: Payer: Self-pay

## 2011-12-11 NOTE — Progress Notes (Signed)
ALLERGY SERUM ADMINISTRATION Date: 12/11/2011    Pre -Injection Assessment  allergy symptoms controlled with medication  Administration  Serum A 0.25 cc  proximal  right upper arm  Serum B 0.25 cc  proximal  left upper arm  Post-Injection Assessment  No problems    Alben Spittle [facility]

## 2011-12-25 ENCOUNTER — Ambulatory Visit: Payer: Self-pay

## 2011-12-25 NOTE — Progress Notes (Signed)
ALLERGY SERUM ADMINISTRATION Date: 12/25/2011    Pre -Injection Assessment  allergy symptoms well controlled  Administration  Serum A 0.25 cc  proximal  right upper arm  Serum B 0.25 cc  proximal  right upper arm  Post-Injection Assessment  No reaction    Diana Bell

## 2012-01-22 ENCOUNTER — Ambulatory Visit: Payer: Self-pay

## 2012-01-22 NOTE — Progress Notes (Signed)
ALLERGY SERUM ADMINISTRATION Date: 01/22/2012    Pre -Injection Assessment  allergy symptoms controlled with medication  Administration  Serum A 0.15cc  proximal  right upper arm  Serum B 0.15cc  proximal  left upper arm  Post-Injection Assessment  New vials    Alben Spittle Juanita Devincent

## 2012-02-04 ENCOUNTER — Ambulatory Visit: Payer: Self-pay | Admitting: Primary Care

## 2012-02-07 ENCOUNTER — Ambulatory Visit: Payer: Self-pay | Admitting: Primary Care

## 2012-02-08 ENCOUNTER — Telehealth: Payer: Self-pay | Admitting: Primary Care

## 2012-02-08 DIAGNOSIS — Z139 Encounter for screening, unspecified: Secondary | ICD-10-CM

## 2012-02-08 NOTE — Telephone Encounter (Signed)
Pt would like to have labs checked before her appointment next week. Please place in the system and I will call her to let her know this has been done.

## 2012-02-11 NOTE — Telephone Encounter (Signed)
Left message stating she can have labs done

## 2012-02-12 ENCOUNTER — Ambulatory Visit
Admit: 2012-02-12 | Discharge: 2012-02-12 | Disposition: A | Discharge status: Home / Self Care | Payer: Self-pay | Source: Ambulatory Visit | Attending: Primary Care | Admitting: Primary Care

## 2012-02-12 DIAGNOSIS — Z139 Encounter for screening, unspecified: Secondary | ICD-10-CM

## 2012-02-12 LAB — LIPID PANEL
Chol/HDL Ratio: 3.2
Cholesterol: 188 mg/dL
HDL: 59 mg/dL
LDL Calculated: 102 mg/dL
Non HDL Cholesterol: 129 mg/dL
Triglycerides: 134 mg/dL

## 2012-02-12 LAB — HEPATIC FUNCTION PANEL
ALT: 28 U/L (ref 0–35)
AST: 24 U/L (ref 0–35)
Albumin: 4.3 g/dL (ref 3.5–5.2)
Alk Phos: 70 U/L (ref 35–105)
Bilirubin,Direct: 0.1 mg/dL (ref 0.0–0.3)
Bilirubin,Total: 0.4 mg/dL (ref 0.0–1.2)
Total Protein: 6.5 g/dL (ref 6.3–7.7)

## 2012-02-12 LAB — BASIC METABOLIC PANEL
Anion Gap: 10 (ref 7–16)
CO2: 27 mmol/L (ref 20–28)
Calcium: 9.3 mg/dL (ref 8.6–10.2)
Chloride: 101 mmol/L (ref 96–108)
Creatinine: 0.86 mg/dL (ref 0.51–0.95)
GFR,Black: 93 *
GFR,Caucasian: 80 *
Glucose: 104 mg/dL — ABNORMAL HIGH (ref 60–99)
Lab: 18 mg/dL (ref 6–20)
Potassium: 4.3 mmol/L (ref 3.3–5.1)
Sodium: 138 mmol/L (ref 133–145)

## 2012-02-12 LAB — CBC
Hematocrit: 40 % (ref 34–45)
Hemoglobin: 13.3 g/dL (ref 11.2–15.7)
MCV: 86 fL (ref 79–95)
Platelets: 236 10*3/uL (ref 160–370)
RBC: 4.7 MIL/uL (ref 3.9–5.2)
RDW: 12.7 % (ref 11.7–14.4)
WBC: 6.1 10*3/uL (ref 4.0–10.0)

## 2012-02-13 ENCOUNTER — Telehealth: Payer: Self-pay | Admitting: Primary Care

## 2012-02-13 NOTE — Telephone Encounter (Signed)
Pt.notified

## 2012-02-13 NOTE — Telephone Encounter (Signed)
Message copied by Birdie Hopes on Wed Feb 13, 2012  8:26 AM  ------       Message from: Collier Flowers       Created: Tue Feb 12, 2012  4:56 PM         Mild glucose elevation       Other labs are normal  ------

## 2012-02-14 ENCOUNTER — Encounter: Payer: Self-pay | Admitting: Primary Care

## 2012-02-14 ENCOUNTER — Ambulatory Visit: Payer: Self-pay | Admitting: Primary Care

## 2012-02-14 VITALS — BP 112/80 | HR 78 | Ht 66.0 in | Wt 167.0 lb

## 2012-02-14 DIAGNOSIS — R7989 Other specified abnormal findings of blood chemistry: Secondary | ICD-10-CM

## 2012-02-14 NOTE — Progress Notes (Signed)
Diana Bell is here in follow up of elevated liver function tests, allergies.  She's been exercising regularly running 3-4 days per week a minimum of 3 miles.  She has no chest pain palpitation shortness of breath PND or orthopnea.  She's been able to lose some weight and generally feels very well.    Allergy injections also working for symptoms.    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  This will be a full-time position.      Filed Vitals:    02/14/12 0801   BP: 112/80   Pulse: 78   Height: 1.676 m (5\' 6" )   Weight: 75.751 kg (167 lb)       Medication list reviewed and updated at appointment.  Changes were made    Current Outpatient Prescriptions on File Prior to Visit   Medication Sig Dispense Refill   . Evening Primrose Oil CAPS Take by mouth daily           No current facility-administered medications on file prior to visit.       GENERAL:  Alert, no distress.  Well-nourished and well-developed  HEENT:  NCAT, PERRLA, EOMI.  Anicteric  NECK:  Supple without adenopathy.  No JVD or thyromegaly   CARDIAC:  RRR, S1 S2, no murmur  LUNGS:  Clear to auscultation bilaterally  ABDOMEN:  Soft, nontender, nondistended with positive bowel sounds, no masses or organomegaly  SKIN:  warm and dry, no rashes  EXTREMITIES: No clubbing cyanosis or edema  NEURO: Nonfocal exam, normal gait, No tremor     Assessment and plan:    Elevated liver function tests.  Have corrected with diet exercise weight reduction.  Encouraged continuation of the same as well as limitation of alcohol and Tylenol.    Hyperglycemia.  Believe this is related to the fact that the patient was not fasting before her blood work.    Allergic rhinitis.  Much improved with allergy injections.  She's been doing this now for about 3 years through ENT office and is no longer needing OTC meds.

## 2012-02-27 ENCOUNTER — Encounter: Payer: Self-pay | Admitting: Gastroenterology

## 2012-02-27 LAB — HM MAMMOGRAPHY

## 2012-02-29 ENCOUNTER — Encounter: Payer: Self-pay | Admitting: Primary Care

## 2012-03-03 ENCOUNTER — Ambulatory Visit: Payer: Self-pay

## 2012-03-03 ENCOUNTER — Encounter: Payer: Self-pay | Admitting: Primary Care

## 2012-03-03 NOTE — Progress Notes (Signed)
ALLERGY SERUM ADMINISTRATION Date: 03/03/2012    Pre -Injection Assessment  allergy symptoms well controlled  Diana Bell has been on monthly injections for about 3 months now and every 2 weeks for some time before that. She takes no allergy medication and has no symptoms at all and did well even this spring.  Administration  Serum A 0.25 cc  proximal  right upper arm  Serum B 0.25 cc  proximal  left upper arm  Post-Injection Assessment  No reaction    [name]

## 2012-03-04 ENCOUNTER — Encounter: Payer: Self-pay | Admitting: Otolaryngology

## 2012-03-04 NOTE — Progress Notes (Signed)
Pt doing well on monthly injections.  Began IT in 2009.  Would recommend continuing until 2014 at monthly injections.  Maurilio Lovely, MD

## 2012-03-31 ENCOUNTER — Telehealth: Payer: Self-pay | Admitting: Gastroenterology

## 2012-03-31 NOTE — Telephone Encounter (Signed)
Patient calling to reschedule 9/11 appointment, please call Destanie at [phone] to reschedule. Wednesday is not a good day for patient

## 2012-03-31 NOTE — Telephone Encounter (Signed)
Called patient and scheduled FUA with Dr. Domingo Dimes on 05/20/12 @11 :30a.

## 2012-04-09 ENCOUNTER — Ambulatory Visit: Payer: Self-pay

## 2012-04-09 DIAGNOSIS — Z111 Encounter for screening for respiratory tuberculosis: Secondary | ICD-10-CM

## 2012-04-09 NOTE — Progress Notes (Signed)
PPD placed for work. States she will have it read at work.

## 2012-04-17 ENCOUNTER — Ambulatory Visit: Payer: Self-pay

## 2012-04-17 NOTE — Progress Notes (Signed)
ALLERGY SERUM ADMINISTRATION Date: 04/17/2012    Pre -Injection Assessment  allergy symptoms controlled with medication  Administration  Serum A 0.2cc  proximal  right upper arm  Serum B 0.2cc  proximal  left upper arm  Post-Injection Assessment  > 1 month since last injection.  Instructed to come within 1 month, plan to d/c injections at start of 2014    Alben Spittle [facility]

## 2012-04-30 ENCOUNTER — Ambulatory Visit: Payer: Self-pay | Admitting: Gastroenterology

## 2012-05-08 ENCOUNTER — Ambulatory Visit: Payer: Self-pay

## 2012-05-08 NOTE — Progress Notes (Signed)
ALLERGY SERUM ADMINISTRATION Date: 05/08/2012    Pre -Injection Assessment  allergy symptoms controlled with medication, had increased symptoms with bringing fresh flowers in the house  Administration  Serum A 0.25 cc  proximal  right upper arm  Serum B 0.25 cc  proximal  left upper arm  Post-Injection Assessment  No problems with injections    Alben Spittle Micayla Brathwaite

## 2012-05-20 ENCOUNTER — Ambulatory Visit
Admit: 2012-05-20 | Discharge: 2012-05-20 | Disposition: A | Discharge status: Home / Self Care | Payer: Self-pay | Attending: Gastroenterology | Admitting: Gastroenterology

## 2012-05-20 ENCOUNTER — Encounter: Payer: Self-pay | Admitting: Gastroenterology

## 2012-05-20 VITALS — BP 121/80 | HR 60 | Ht 67.0 in | Wt 171.0 lb

## 2012-05-20 DIAGNOSIS — R7989 Other specified abnormal findings of blood chemistry: Secondary | ICD-10-CM

## 2012-05-20 NOTE — Progress Notes (Addendum)
[name]  [id number]    05/20/2012  Collier Flowers, DO  [address] [place] [address]  [place], [place] [address]      I had the pleasure of seeing your patient Diana Bell in the outpatient gastroenterology/hepatology clinic. As you know, she is a 46 y.o. female who presents for further evaluation of mild fatty liver disease.     She was evaluated 10/2011 and celiac screen was negative, her iron is low and oral supplementation could be considered especially as she donates blood frequently.  It was recommended that diet/lose weight of 5-10 lbs and repeat labs in 3 months.  She comes in to the office today for clinical follow up.     She feels well and offers no complaints.  She has been actively exercising running 3-4 times/week.  She is eating well an avoiding all hepatotoxic agents.       Vitals:   Filed Vitals:    05/20/12 1132   BP: 121/80   Pulse: 60   Height: 170.2 cm (5\' 7" )   Weight: 77.565 kg (171 lb)     Body mass index is 26.78 kg/(m^2).  Allergies/Sensitivities:  Allergies   Allergen Reactions   . Environmental Allergies    . No Known Drug Allergy    . No Known Latex Allergy        Medications:   Current Outpatient Prescriptions   Medication Sig Dispense Refill   . calcium carbonate-vitamin D 600-400 MG-UNIT per tablet Take 1 tablet by mouth daily       . Multiple Vitamins-Minerals (MULTI COMPLETE PO) Take by mouth       . cholecalciferol (VITAMIN D) 1000 UNIT tablet Take 1,000 Units by mouth daily       . Omega-3 Fatty Acids (FISH OIL) 1200 MG CAPS Take by mouth       . Evening Primrose Oil CAPS Take by mouth daily         . fluconazole (DIFLUCAN) 150 MG tablet          No current facility-administered medications for this encounter.       ROS: See above for pertinent positives.     Physical Exam:   General: Well developed, well nourished, in NAD.  Appears stated age.  HEENT: sclera anicteric. Oral mucosa pink and moist.   Lungs: Normal respiratory effort, clear bilaterally to auscultation.    Cor: RRR without  murmur.    Abdomen: NABS, no distention or tenderness. No hepatosplenomegaly, hernias, masses, or ascites.   Rectal: Deferred to primary care physician, not relevant to this evaluation.    Extr: Warm, well-perfused. No edema.    Neuro: A and O x 3.  Grossly no focal motor deficits. No tremors, no asterixis. Gait steady, speech is clear.   Skin: No jaundice, rash, or ulcers.     Labs/Imaging:   Results for DERINDA, GILLINS (MRN [id number]) as of 05/20/2012 11:33   Ref. Range 02/12/2012 12:55   Sodium Latest Range: 133-145 mmol/L 138   Potassium Latest Range: 3.3-5.1 mmol/L 4.3   Chloride Latest Range: 96-108 mmol/L 101   CO2 Latest Range: 20-28 mmol/L 27   Anion Gap Latest Range: 7-16  10   UN Latest Range: 6-20 mg/dL 18   Creatinine Latest Range: 0.51-0.95 mg/dL 8.29   GFR,Black No range found 93   GFR,Caucasian No range found 80   Glucose Latest Range: 60-99 mg/dL 562 (H)   Calcium Latest Range: 8.6-10.2 mg/dL 9.3   Total Protein Latest  Range: 6.3-7.7 g/dL 6.5   Albumin Latest Range: 3.5-5.2 g/dL 4.3   ALT Latest Range: 0-35 U/L 28   AST Latest Range: 0-35 U/L 24   Alk Phos Latest Range: 35-105 U/L 70   Bilirubin,Direct Latest Range: 0.0-0.3 mg/dL 0.1   Bilirubin,Total Latest Range: 0.0-1.2 mg/dL 0.4   Cholesterol No range found 188   Triglycerides No range found 134   HDL No range found 59   LDL Calculated No range found 102   Non HDL Cholesterol No range found 129   Chol/HDL Ratio No range found 3.2   WBC Latest Range: 4.0-10.0 THOU/uL 6.1   RBC Latest Range: 3.9-5.2 MIL/uL 4.7   Hemoglobin Latest Range: 11.2-15.7 g/dL 16.1   Hematocrit Latest Range: 34-45 % 40   MCV Latest Range: 79-95 fL 86   RDW Latest Range: 11.7-14.4 % 12.7   Platelets Latest Range: 160-370 THOU/uL 236   Results for ZOIEE, KALINOWSKI (MRN [id number]) as of 05/20/2012 11:33   Ref. Range 01/29/2011 08:33 08/01/2011 08:20 10/24/2011 08:27   ANA Screen Latest Range: NEGATIVE  POS (A)     ANA Pattern No range found NUCLEOLAR     ANA Titer No range found 80 (A)      tTG,IgA Latest Range: 0.0-19.9 AB/Units   6.8   IgA Latest Range: 70-400 mg/dL   098   A-1 Antitrypsin Latest Range: 90-200 mg/dL   119     US abdomen 14/78/2956  IMPRESSION: Normal abdominal ultrasound.    Impression(s)/Recommendation(s):   46 yo female with mild fatty liver disease now with normalization of her liver enzymes with increase exercise and weight reduction.  She has normal hepatic synthetic function.     Abnormal liver enzymes- likely related to fatty liver.We have recommended that she continue with healthy lifestyle and avoid hepatotoxic agents.  She should have her liver enzymes checked biannually.  We complemented her for her diligence in following a good diet and exercise regimen that has already shown good results We encouraged her to stay the course.  We reassured her that her liver functions were normal and that with continued to healthy lifestyle, her chances of developing chronic liver disease and liver failure are slim to none.  At this point we do not think that autoimmune processes are playing a significant role in causing her liver enzyme changes.  We will see her back for clinical follow up as medically necessary or in a year.    Thank you for allowing [place] to participate in this patient's care.  Please do not hesistate to contact us with any questions or concerns at [phone].      Thereasa Distance, NP    Administrative addendum:  The MLP is functioning as a scribe in typing this note for me (Dr. Domingo Dimes). I personally saw and examined this patient, formulated the assessment, and directed the management.  I also reviewed and edited this note before signature    Marlow Baars, MD

## 2012-06-10 ENCOUNTER — Ambulatory Visit: Payer: Self-pay

## 2012-06-10 NOTE — Progress Notes (Signed)
ALLERGY SERUM ADMINISTRATION Date: 06/10/2012    Pre -Injection Assessment  allergy symptoms controlled with medication  Administration  Serum A 0.15cc  proximal  right upper arm  Serum B 0.15cc  proximal  left upper arm  Post-Injection Assessment  New vials    Diana Bell

## 2012-07-04 ENCOUNTER — Ambulatory Visit: Payer: Self-pay

## 2012-07-04 NOTE — Progress Notes (Signed)
ALLERGY SERUM ADMINISTRATION Date: 07/04/2012    Pre -Injection Assessment  no arm reactions after last injections.  She noted mild dizziness the next morning and about 1 week after injections she had allergy symptoms.  Seems unrelated to injections, will repeat doses  Administration  Serum A 0.15cc  proximal  right upper arm  Serum B 0.15cc  proximal  left upper arm  Post-Injection Assessment  No problems    Alben Spittle [facility]

## 2012-08-01 ENCOUNTER — Ambulatory Visit: Payer: Self-pay

## 2012-08-01 ENCOUNTER — Ambulatory Visit
Admit: 2012-08-01 | Discharge: 2012-08-01 | Disposition: A | Discharge status: Home / Self Care | Payer: Self-pay | Source: Ambulatory Visit | Attending: Gastroenterology | Admitting: Gastroenterology

## 2012-08-01 DIAGNOSIS — R7989 Other specified abnormal findings of blood chemistry: Secondary | ICD-10-CM

## 2012-08-01 LAB — COMPREHENSIVE METABOLIC PANEL
ALT: 21 U/L (ref 0–35)
AST: 19 U/L (ref 0–35)
Albumin: 4.2 g/dL (ref 3.5–5.2)
Alk Phos: 62 U/L (ref 35–105)
Anion Gap: 9 (ref 7–16)
Bilirubin,Total: 0.4 mg/dL (ref 0.0–1.2)
CO2: 26 mmol/L (ref 20–28)
Calcium: 9 mg/dL (ref 8.6–10.2)
Chloride: 104 mmol/L (ref 96–108)
Creatinine: 0.89 mg/dL (ref 0.51–0.95)
GFR,Black: 89 *
GFR,Caucasian: 77 *
Glucose: 81 mg/dL (ref 60–99)
Lab: 17 mg/dL (ref 6–20)
Potassium: 4.2 mmol/L (ref 3.3–5.1)
Sodium: 139 mmol/L (ref 133–145)
Total Protein: 6.5 g/dL (ref 6.3–7.7)

## 2012-08-01 NOTE — Progress Notes (Signed)
ALLERGY SERUM ADMINISTRATION Date: 08/01/2012    Pre -Injection Assessment  no problems, feeling well  Administration  Serum A 0.2cc  proximal  right upper arm  Serum B 0.2cc  proximal  left upper arm  Post-Injection Assessment  No problems    Diana Bell [facility]

## 2012-08-07 ENCOUNTER — Encounter: Payer: Self-pay | Admitting: Primary Care

## 2012-08-07 ENCOUNTER — Ambulatory Visit: Payer: Self-pay | Admitting: Primary Care

## 2012-08-07 VITALS — BP 124/80 | HR 78 | Ht 67.0 in | Wt 170.6 lb

## 2012-08-07 DIAGNOSIS — R7989 Other specified abnormal findings of blood chemistry: Secondary | ICD-10-CM

## 2012-08-07 DIAGNOSIS — Z0000 Encounter for general adult medical examination without abnormal findings: Secondary | ICD-10-CM

## 2012-08-07 NOTE — Progress Notes (Signed)
Diana Bell is here in follow up of elevated liver function tests thought.  They did to fatty liver.  She is also receiving allergy injections which have kept her allergy symptoms well controlled.  She does not need to take daily medications with these injections.    Try to exercise regularly watch her diet.  Frustrated about no weight loss.  Does not have chest pain palpitation shortness of breath PND or orthopnea.    Recent labs reviewed in detail.    Filed Vitals:    08/07/12 0801   BP: 124/80   Pulse: 78   Height: 1.702 m (5\' 7" )   Weight: 77.384 kg (170 lb 9.6 oz)     Current Outpatient Prescriptions on File Prior to Visit   Medication Sig Dispense Refill   . calcium carbonate-vitamin D 600-400 MG-UNIT per tablet Take 1 tablet by mouth daily       . Multiple Vitamins-Minerals (MULTI COMPLETE PO) Take by mouth       . Omega-3 Fatty Acids (FISH OIL) 1200 MG CAPS Take by mouth       . Evening Primrose Oil CAPS Take by mouth daily           No current facility-administered medications on file prior to visit.       Medication list reviewed and updated at appointment.  Changes were made    GENERAL:  Alert, no distress.  Well-nourished and well-developed  HEENT:   Anicteric  NECK:  Supple without adenopathy  CARDIAC:  RRR, S1 S2, no murmur  LUNGS:  Clear to auscultation bilaterally  ABDOMEN:  Soft, nontender, nondistended with positive bowel sounds, no masses or organomegaly  SKIN:  warm and dry, no rashes  EXTREMITIES: No clubbing cyanosis or edema  NEURO: Nonfocal exam, normal gait    Assessment and plan:      Allergic rhinitis. Receiving injections. When these stop will use clarinex D, singulair, nasonex.    Elevated liver function.  Recent labs are normal.  Continue with diet exercise and weight reduction efforts.    Flu shot was given.

## 2012-08-25 ENCOUNTER — Telehealth: Payer: Self-pay | Admitting: Otolaryngology

## 2012-08-25 NOTE — Telephone Encounter (Signed)
Diana Bell has an appointment tomorrow with Dr.Chase Hyacinth Meeker.  Her insurance recently changed and she does not have her new insurance card yet.  Can she still come in for her appointment, or should she reschedule?  Please call her back at [phone].

## 2012-08-26 ENCOUNTER — Encounter: Payer: Self-pay | Admitting: Otolaryngology

## 2012-08-29 ENCOUNTER — Ambulatory Visit: Payer: Self-pay

## 2012-08-29 NOTE — Progress Notes (Signed)
ALLERGY SERUM ADMINISTRATION Date: 08/29/2012    Pre -Injection Assessment  allergy symptoms controlled with medication, has been having rhinorrhea  Administration  Serum A 0.2cc  proximal  right upper arm  Serum B 0.2cc  proximal  left upper arm  Post-Injection Assessment  No problem    Alben Spittle [facility]

## 2012-09-22 ENCOUNTER — Telehealth: Payer: Self-pay

## 2012-09-22 NOTE — Telephone Encounter (Signed)
I called to remind not to take any antihistamines for 5 days prior to vial testing. Patient states understanding; coming in next week

## 2012-09-26 ENCOUNTER — Encounter: Payer: Self-pay | Admitting: Otolaryngology

## 2012-09-26 ENCOUNTER — Ambulatory Visit: Payer: Self-pay | Admitting: Otolaryngology

## 2012-09-26 VITALS — BP 110/66 | HR 67 | Ht 67.0 in | Wt 173.0 lb

## 2012-09-26 DIAGNOSIS — J309 Allergic rhinitis, unspecified: Secondary | ICD-10-CM

## 2012-09-26 MED ORDER — DESLORATADINE 5 MG PO TABS *I*
5.0000 mg | ORAL_TABLET | Freq: Every day | ORAL | Status: DC
Start: 2012-09-26 — End: 2013-02-04

## 2012-09-26 NOTE — Progress Notes (Signed)
DEPARTMENT OF OTOLARYNGOLOGY    Today's Date: 09/26/2012     Chief Complaint: Recheck allergies    HPI:  Diana Bell is a 47 y.o. year old female seen in follow up with a history of perennial and seasonal allergic rhinitis.  Patient began allergy immunotherapy in September 2009.  She is presently on monthly allergy injections and is doing quite well.  She has not required any supplemental medications and has no seasonal bursts of symptoms.  Denies asthma symptoms.                     ROS:  Patient is taking Clarinex in the past as needed    Allergies:   Environmental allergies; No known drug allergy; and No known latex allergy    Active Medications:  Current Outpatient Prescriptions   Medication   . calcium carbonate-vitamin D 600-400 MG-UNIT per tablet   . Multiple Vitamins-Minerals (MULTI COMPLETE PO)   . Omega-3 Fatty Acids (FISH OIL) 1200 MG CAPS   . Evening Primrose Oil CAPS   . desloratadine (CLARINEX) 5 MG tablet     No current facility-administered medications for this visit.       Electronic record reviewed for PMH, PSH    Physical Examination:  Healthy appearing 47 y.o. year old female  Blood pressure 110/66, pulse 67, height 1.702 m (5\' 7" ), weight 78.472 kg (173 lb).   Comprehensive ENT exam performed  EARS:external auditory canal patent bilaterally.  Both tympanic membranes are intact and mobile.  NOSE: Right anterior deviated septum  ORAL:Oral mucosa is moist.  There are no oral cavity lesions.  Tongue has normal mobility.  Oropharyngeal exam reveals nonobstructing tonsils.  The posterior pharyngeal wall is without erythema.  LARYNX: Not visualized  NECK: No adenopathy       Assessment:  47 year old female with history of allergic rhinitis adequately controlled on allergy immunotherapy.  It has now been over 5 years since she was treated and symptoms are well-controlled.  I recommended she go off her monthly injections.  She was given a prescription for Clarinex for as-needed.        Maurilio Lovely,  MD as of 3:11 PM, 09/26/2012

## 2012-09-26 NOTE — Patient Instructions (Signed)
Seasonal Allergies Discharge Instructions    About this topic   Seasonal allergies are also called allergic rhinitis. It is a set of signs. They happen when you breathe in something you are allergic to, such as dust or pollen. These are called allergens. The signs are:  · Stuffy nose  · Sneezing  · Runny nose  · Itchy eyes  What care is needed at home?   · Ask your doctor what you need to do when you go home. Make sure you ask questions if you do not understand what the doctor says. This way you will know what you need to do.  · Consider using an air purifier in your home, mainly in your bedroom.  · Use vacuum cleaners and air conditioners with HEPA filters. These can trap allergens.  What follow-up care is needed?   Your doctor may ask you to make visits to the office to check on your progress. Be sure to keep these visits.  What drugs may be needed?   The doctor may order drugs to treat the signs. Take your drugs as ordered. You may also take allergy shots if you have had allergy tests.  Will physical activity be limited?   You may have to limit your activity. If your health problem is caused by an allergy to pollens or molds, you may want to limit your time outdoors. Talk to your doctor about what activities are best for you.  What problems could happen?   · Asthma may get worse  · Sinus infection  What can be done to prevent this health problem?   Try to avoid allergens.  · If you are allergic to pollens, grasses, trees, etc:  ¨ Stay inside in the morning when pollen counts are high.  ¨ Avoid going outside when the trees, grasses, or weeds are blooming.  ¨ Keep the windows of your house and car closed.  ¨ Use an air conditioner.  · If you are allergic to dust, molds, dust mites, pets, etc:  ¨ Clean your air conditioner or furnace filters regularly.  ¨ Cover pillows and mattresses with vinyl covers to lower your exposure to dust mites.  ¨ Wash bedding weekly in very hot water.  ¨ Use fewer dust-collecting  items, such as curtains, bed skirts, carpeting, and stuffed animals, mainly in your bedroom.  ¨ If you can't avoid having a furry pet, vacuum often and keep your pet out of bedrooms and other rooms with carpets.  When do I need to call the doctor?   Health problem is not better or you are feeling worse  Where can I learn more?   American Academy of Allergy Asthma & Immunology  http://www.aaaai.org/conditions-and-treatments/allergies/rhinitis.aspx   Asthma and Allergy Foundation of America  http://www.aafa.org/display.cfm?id=9&sub=19&cont=268   Last Reviewed Date   2011-02-08  Disclaimer   This information is not specific medical advice and does not replace information you receive from your health care provider. This is only a brief summary of general information. It does NOT include all information about conditions, illnesses, injuries, tests, procedures, treatments, therapies, discharge instructions or life-style choices that may apply to you. You must talk with your health care provider for complete information about your health and treatment options. This information should not be used to decide whether or not to accept your health care provider’s advice, instructions or recommendations. Only your health care provider has the knowledge and training to provide advice that is right for you.  Copyright   All content   copyright © 1978-2013 [employer] or its respective owners. All Rights Reserved.

## 2013-01-30 ENCOUNTER — Ambulatory Visit
Admit: 2013-01-30 | Discharge: 2013-01-30 | Disposition: A | Discharge status: Home / Self Care | Payer: Self-pay | Source: Ambulatory Visit | Attending: Primary Care | Admitting: Primary Care

## 2013-01-30 DIAGNOSIS — R7989 Other specified abnormal findings of blood chemistry: Secondary | ICD-10-CM

## 2013-01-30 LAB — LIPID PANEL
Chol/HDL Ratio: 2.9
Cholesterol: 160 mg/dL
HDL: 56 mg/dL
LDL Calculated: 89 mg/dL
Non HDL Cholesterol: 104 mg/dL
Triglycerides: 76 mg/dL

## 2013-01-30 LAB — HEPATIC FUNCTION PANEL
ALT: 18 U/L (ref 0–35)
AST: 21 U/L (ref 0–35)
Albumin: 4.4 g/dL (ref 3.5–5.2)
Alk Phos: 67 U/L (ref 35–105)
Bilirubin,Direct: <0.2 mg/dL (ref 0.0–0.3)
Bilirubin,Total: 0.4 mg/dL (ref 0.0–1.2)
Total Protein: 6.8 g/dL (ref 6.3–7.7)

## 2013-01-30 LAB — BASIC METABOLIC PANEL
Anion Gap: 8 (ref 7–16)
CO2: 29 mmol/L — ABNORMAL HIGH (ref 20–28)
Calcium: 9.9 mg/dL (ref 8.6–10.2)
Chloride: 103 mmol/L (ref 96–108)
Creatinine: 0.92 mg/dL (ref 0.51–0.95)
GFR,Black: 85 *
GFR,Caucasian: 74 *
Glucose: 62 mg/dL (ref 60–99)
Lab: 17 mg/dL (ref 6–20)
Potassium: 4.6 mmol/L (ref 3.3–5.1)
Sodium: 140 mmol/L (ref 133–145)

## 2013-01-30 LAB — CBC
Hematocrit: 40 % (ref 34–45)
Hemoglobin: 12.7 g/dL (ref 11.2–15.7)
MCV: 88 fL (ref 79–95)
Platelets: 210 10*3/uL (ref 160–370)
RBC: 4.6 MIL/uL (ref 3.9–5.2)
RDW: 12.3 % (ref 11.7–14.4)
WBC: 4.9 10*3/uL (ref 4.0–10.0)

## 2013-02-04 ENCOUNTER — Ambulatory Visit: Payer: Self-pay | Admitting: Primary Care

## 2013-02-04 ENCOUNTER — Encounter: Payer: Self-pay | Admitting: Primary Care

## 2013-02-04 VITALS — BP 116/80 | HR 72 | Ht 67.0 in | Wt 168.2 lb

## 2013-02-04 DIAGNOSIS — K760 Fatty (change of) liver, not elsewhere classified: Secondary | ICD-10-CM

## 2013-02-04 NOTE — Progress Notes (Signed)
Diana Bell is here in followup of her fatty liver with mildly elevated liver function tests.    She's done a great job with diet and exercise routine.  She's lost a couple pounds of weight.  She's had it fruit and vegetables to her diet.  She's been able to improve her liver function and glucose test.    Her labs were reviewed in detail.    She denies any headache chest pain palpitation dizziness or shortness of breath.      Filed Vitals:    02/04/13 1332   BP: 116/80   Pulse: 72   Height: 1.702 m (5\' 7" )   Weight: 76.295 kg (168 lb 3.2 oz)       Medication list reviewed and updated at appointment.  Changes were made    Current Outpatient Prescriptions on File Prior to Visit   Medication Sig Dispense Refill   . calcium carbonate-vitamin D 600-400 MG-UNIT per tablet Take 1 tablet by mouth daily       . Multiple Vitamins-Minerals (MULTI COMPLETE PO) Take by mouth         No current facility-administered medications on file prior to visit.       On physical exam she's well-nourished well-developed no acute distress.  Her eyes are anicteric.  Her neck is supple.  Her lungs are clear without wheezing.  Heart regular rate and rhythm no murmur.  Her abdomen is soft nontender with normoactive bowel sounds and no hepatosplenomegaly.  Skin warm dry no rashes.    Assessment and plan    Elevated liver function tests thought to be due to mild fatty liver.  These have improved with lifestyle modifications.  The patient will continue with the same in followup in about a year for repeat lab work.  Return for visit in about 2 years with a physical.

## 2013-02-12 ENCOUNTER — Telehealth: Payer: Self-pay | Admitting: Primary Care

## 2013-02-12 NOTE — Telephone Encounter (Signed)
Attempted  To reach pt about her mammogram, but phone number is invalid

## 2013-04-03 ENCOUNTER — Encounter: Payer: Self-pay | Admitting: Primary Care

## 2013-04-03 NOTE — Telephone Encounter (Signed)
Unable to reach patient, letter sent via My Chart.

## 2013-05-20 ENCOUNTER — Encounter: Payer: Self-pay | Admitting: Primary Care

## 2013-10-29 ENCOUNTER — Encounter: Payer: Self-pay | Admitting: Gastroenterology

## 2013-11-12 LAB — HM MAMMOGRAPHY

## 2013-11-13 ENCOUNTER — Encounter: Payer: Self-pay | Admitting: Gastroenterology

## 2014-05-17 ENCOUNTER — Encounter: Payer: Self-pay | Admitting: Gastroenterology

## 2014-09-08 ENCOUNTER — Encounter: Payer: Self-pay | Admitting: Primary Care

## 2014-11-15 ENCOUNTER — Encounter: Payer: Self-pay | Admitting: Gastroenterology

## 2014-11-15 LAB — HM MAMMOGRAPHY

## 2014-11-19 ENCOUNTER — Encounter: Payer: Self-pay | Admitting: Primary Care

## 2015-02-10 NOTE — Telephone Encounter (Signed)
My chart message not read; Sent letter via My Chart and mail.

## 2015-02-25 ENCOUNTER — Encounter (INDEPENDENT_AMBULATORY_CARE_PROVIDER_SITE_OTHER): Payer: Self-pay | Admitting: Primary Care

## 2015-02-25 DIAGNOSIS — Z1211 Encounter for screening for malignant neoplasm of colon: Secondary | ICD-10-CM

## 2015-02-25 NOTE — Telephone Encounter (Signed)
Birdie HopesDietz, Erica, LPN 1/61/09606/29/2016 4:548:13 AM EDT        ----- Message -----   From: Ezequiel GanserSuzanne Michon   Sent: 02/15/2015 9:59 PM   To: Cindy HazyPanorama Nurse  Subject: UJ:[id number]:colonoscopy     Hello,   I would like to know how I would go about getting a colonoscopy.  Thanks for your help.  Bing ReeSue O'Brien  ----- Message -----  From: Birdie HopesErica Dietz, LPN   Sent: 6/21/30862/29/2016 3:45 PM EST   To: Ezequiel GanserSuzanne Dullea  Subject: colonoscopy     Hi ANA,    This is a reminder that your are due for your Colonoscopy. Please let us know if you need assistance in setting this up with your GI provider, or if you need a referral.    Thanks,  Alcario DroughtErica

## 2015-03-01 ENCOUNTER — Telehealth: Payer: Self-pay | Admitting: Primary Care

## 2015-03-01 DIAGNOSIS — Z1211 Encounter for screening for malignant neoplasm of colon: Secondary | ICD-10-CM

## 2015-03-01 NOTE — Telephone Encounter (Signed)
Phone call from patient, she would like to schedule her first colonoscopy.  She agreed to go to [facility].  Process explained and that the appointment may not be until January 2017.  Please send an order for this test.

## 2015-03-02 NOTE — Telephone Encounter (Signed)
Order placed, please call pt

## 2015-03-19 ENCOUNTER — Encounter: Payer: Self-pay | Admitting: Primary Care

## 2015-07-13 ENCOUNTER — Telehealth: Payer: Self-pay | Admitting: Primary Care

## 2015-07-13 NOTE — Telephone Encounter (Signed)
Patient needs a varicella booster, she is scheduled for 11/30 at 8am  Please put order in for this to be done, patient states blood work she had done for her job showed she needed this booster.

## 2015-07-14 NOTE — Telephone Encounter (Signed)
Pi would need appointment and I would need varicella titer information    Pt has not been seen since June 2014 and a physical is recommended if cholesterol and liver labs glucose etc were done. If not they are advised    Varicella vaccine is a live virus vaccine and need to do some review before giving

## 2015-07-18 NOTE — Telephone Encounter (Signed)
Called pt @ [phone], phone disconnected    Sent MyChart message to pt

## 2015-07-20 ENCOUNTER — Ambulatory Visit: Payer: Self-pay

## 2015-07-21 ENCOUNTER — Ambulatory Visit: Payer: Self-pay | Admitting: Primary Care

## 2015-07-21 VITALS — BP 110/72 | HR 74 | Ht 67.0 in | Wt 176.0 lb

## 2015-07-21 DIAGNOSIS — R7989 Other specified abnormal findings of blood chemistry: Secondary | ICD-10-CM

## 2015-07-21 DIAGNOSIS — Z139 Encounter for screening, unspecified: Secondary | ICD-10-CM

## 2015-07-21 DIAGNOSIS — Z1211 Encounter for screening for malignant neoplasm of colon: Secondary | ICD-10-CM

## 2015-07-21 DIAGNOSIS — Z23 Encounter for immunization: Secondary | ICD-10-CM

## 2015-07-21 NOTE — Progress Notes (Signed)
Patient ID: Diana Bell is a 49 y.o. year old female.    Chief Complaint:   Chief Complaint   Patient presents with    Follow-up       History of Present Illness     Diana GanserSuzanne Fritcher is a 49 y.o. year old female who presents today for followup of     Works now [employer] and [employer] is advising a repeat vaccine series based on a negative titer from 2011.    Immunization given 08/20/94 and 03/29/2006 per our records    Rosalita ChessmanSuzanne feels well she has no headache chest pain or palpitation.  Her weight is stable.  No gastrointestinal symptoms.  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    Review of Systems:  As per HPI    Allergy:   Reviewed with patient at time of visit  Allergies   Allergen Reactions    Environmental Allergies     No Known Drug Allergy     No Known Latex Allergy        Medications:  Reviewed with patient at time of visit  Current Outpatient Prescriptions   Medication Sig    Omega-3 Fatty Acids (FISH OIL PO) Take by mouth    evening primrose oil capsule Take by mouth daily    calcium carbonate-vitamin D 600-400 MG-UNIT per tablet Take 1 tablet by mouth daily    Multiple Vitamins-Minerals (MULTI COMPLETE PO) Take by mouth     No current facility-administered medications for this visit.        Social History:  Reviewed with patient at time of visit  Social History   Substance Use Topics    Smoking status: Never Smoker    Smokeless tobacco: Never Used    Alcohol use 1.2 oz/week     2 Glasses of wine per week         Physical Exam:     Visit Vitals    BP 110/72    Pulse 74    Ht 1.702 m (5\' 7" )    Wt 79.8 kg (176 lb)    BMI 27.57 kg/m2       GEN: No acute distress  Well  nourished and well developed  EYE: anicteric     Discussion occurred regarding review of systems and previous immunization with the chickenpox vaccine.  I suspect the patient may be a vaccine nonresponder.  At any rate it's been 5 years since her titer and a new titer is requested.      Recent Lab Results:  Lab Results   Component Value Date    NA 140 01/30/2013    K 4.6 01/30/2013    CL 103 01/30/2013    CO2 29 (H) 01/30/2013    UN 17 01/30/2013    CREAT 0.92 01/30/2013    WBC 4.9 01/30/2013    HGB 12.7 01/30/2013    HCT 40 01/30/2013    PLT 210 01/30/2013    TSH 2.76 04/19/2010    CHOL 160 01/30/2013    TRIG 76 01/30/2013  HDL 56 01/30/2013    LDLC 89 01/30/2013    CHHDC 2.9 01/30/2013       Assessment and Plan:     1. Need for prophylactic vaccination and inoculation against varicella     2. Elevated liver function tests     3. Screen for colon cancer  AMB REFERRAL TO GASTROENTEROLOGY   4. Screening  Varicella zoster IgG antibody    Basic metabolic panel    Lipid add Rfx to Drt LDL if Trig >400    Hepatic function panel    CBC and differential    Vitamin D    TSH       1.  Check titer first and then decide whether to give her another vaccine series  2.  Repeat liver function tests  3.  Referral generated for colon cancer screening  4.  Labs before physical anticipated in a month

## 2015-07-25 ENCOUNTER — Ambulatory Visit
Admission: RE | Admit: 2015-07-25 | Discharge: 2015-07-25 | Disposition: A | Discharge status: Home / Self Care | Payer: Self-pay | Source: Ambulatory Visit | Attending: Primary Care | Admitting: Primary Care

## 2015-07-25 DIAGNOSIS — Z139 Encounter for screening, unspecified: Secondary | ICD-10-CM

## 2015-07-25 LAB — CBC AND DIFFERENTIAL
Baso # K/uL: 0 10*3/uL (ref 0.0–0.1)
Basophil %: 0.2 %
Eos # K/uL: 0 10*3/uL (ref 0.0–0.4)
Eosinophil %: 0.6 %
Hematocrit: 39 % (ref 34–45)
Hemoglobin: 12.8 g/dL (ref 11.2–15.7)
IMM Granulocytes #: 0 10*3/uL (ref 0.0–0.1)
IMM Granulocytes: 0.2 %
Lymph # K/uL: 2 10*3/uL (ref 1.2–3.7)
Lymphocyte %: 30.7 %
MCH: 29 pg/cell (ref 26–32)
MCHC: 33 g/dL (ref 32–36)
MCV: 89 fL (ref 79–95)
Mono # K/uL: 0.6 10*3/uL (ref 0.2–0.9)
Monocyte %: 9.2 %
Neut # K/uL: 3.8 10*3/uL (ref 1.6–6.1)
Nucl RBC # K/uL: 0 10*3/uL (ref 0.0–0.0)
Nucl RBC %: 0 /100 WBC (ref 0.0–0.2)
Platelets: 253 10*3/uL (ref 160–370)
RBC: 4.4 MIL/uL (ref 3.9–5.2)
RDW: 12.4 % (ref 11.7–14.4)
Seg Neut %: 59.1 %
WBC: 6.4 10*3/uL (ref 4.0–10.0)

## 2015-07-25 LAB — BASIC METABOLIC PANEL
Anion Gap: 12 (ref 7–16)
CO2: 26 mmol/L (ref 20–28)
Calcium: 8.4 mg/dL — ABNORMAL LOW (ref 8.6–10.2)
Chloride: 103 mmol/L (ref 96–108)
Creatinine: 0.94 mg/dL (ref 0.51–0.95)
GFR,Black: 81 *
GFR,Caucasian: 70 *
Glucose: 91 mg/dL (ref 60–99)
Lab: 18 mg/dL (ref 6–20)
Potassium: 5.1 mmol/L (ref 3.3–5.1)
Sodium: 141 mmol/L (ref 133–145)

## 2015-07-25 LAB — LIPID PANEL
Chol/HDL Ratio: 3.2
Cholesterol: 178 mg/dL
HDL: 56 mg/dL
LDL Calculated: 111 mg/dL
Non HDL Cholesterol: 122 mg/dL
Triglycerides: 53 mg/dL

## 2015-07-25 LAB — HEPATIC FUNCTION PANEL
ALT: 18 U/L (ref 0–35)
AST: 21 U/L (ref 0–35)
Albumin: 3.9 g/dL (ref 3.5–5.2)
Alk Phos: 71 U/L (ref 35–105)
Bilirubin,Direct: <0.2 mg/dL (ref 0.0–0.3)
Bilirubin,Total: 0.2 mg/dL (ref 0.0–1.2)
Total Protein: 6.2 g/dL — ABNORMAL LOW (ref 6.3–7.7)

## 2015-07-25 LAB — TSH: TSH: 2.94 u[IU]/mL (ref 0.27–4.20)

## 2015-07-26 ENCOUNTER — Encounter: Payer: Self-pay | Admitting: Primary Care

## 2015-07-26 LAB — VARICELLA ZOSTER IGG AB: VZV IgG: NEGATIVE

## 2015-07-28 LAB — VITAMIN D
25-OH VIT D2: <4 ng/mL
25-OH VIT D3: 31 ng/mL
25-OH Vit Total: 31 ng/mL (ref 30–60)

## 2015-08-25 ENCOUNTER — Telehealth: Payer: Self-pay

## 2015-08-25 NOTE — Telephone Encounter (Signed)
Ms. Diana Bell needs to be scheduled for a colonoscopy. There are no schedules available at [facility].     Was the patient referred to see a particular provider? no.    Please call the patient to schedule at [phone].    1.  Is the patient taking any prescribed blood thinners? no     2.  Is the patient on any type of Kidney Dialysis? no    3.  Does the patient have any type of cardiac device or a defibrillator? no    4.  Does the patient use a machine to help you breathe at night? no    5.  Is the patients weight greater than 350lbs? no    6.  Is the patient Diabetic? no

## 2015-08-26 NOTE — Telephone Encounter (Signed)
Returned call to patient, scheduled COD with Dr. Octavio GravesWerth on 11/02/15.  Miralax prep sent via Mychart per patient request.

## 2015-09-09 ENCOUNTER — Encounter: Payer: Self-pay | Admitting: Primary Care

## 2015-09-09 ENCOUNTER — Ambulatory Visit: Payer: Self-pay | Admitting: Primary Care

## 2015-09-09 VITALS — BP 116/78 | HR 74 | Ht 67.0 in | Wt 178.0 lb

## 2015-09-09 DIAGNOSIS — R7989 Other specified abnormal findings of blood chemistry: Secondary | ICD-10-CM

## 2015-09-09 DIAGNOSIS — J309 Allergic rhinitis, unspecified: Secondary | ICD-10-CM

## 2015-09-09 DIAGNOSIS — R635 Abnormal weight gain: Secondary | ICD-10-CM

## 2015-09-09 DIAGNOSIS — Z0000 Encounter for general adult medical examination without abnormal findings: Secondary | ICD-10-CM

## 2015-09-09 NOTE — H&P (Signed)
History and Physical    HISTORY:  Chief Complaint   Patient presents with    Annual Exam    Lab Results         History of Present Illness:    HPI  see ROS    Problems:  Patient Active Problem List   Diagnosis Code    Allergy To Cats Z91.09    Abnormal Weight Gain R63.5    Allergic Rhinitis J30.9    Allergic Rhinitis Due To Dust Mite J30.89    Liver function test abnormality R79.89        Past Medical/Surgical History:   Past Medical History   Diagnosis Date    Allergic rhinitis     Elevated LFTs     Raynaud disease      No past surgical history on file.      Allergies:    Allergies   Allergen Reactions    Environmental Allergies     No Known Drug Allergy     No Known Latex Allergy        Current medications:    Current Outpatient Prescriptions   Medication Sig    Omega-3 Fatty Acids (FISH OIL PO) Take by mouth    evening primrose oil capsule Take by mouth daily    calcium carbonate-vitamin D 600-400 MG-UNIT per tablet Take 1 tablet by mouth daily    Multiple Vitamins-Minerals (MULTI COMPLETE PO) Take by mouth       Family History:    Family History   Problem Relation Age of Onset    Heart Disease Mother     Hypertension Mother     Anesth problems Mother     Cancer Father     Colon cancer Paternal Grandmother      57's       Social/Occupational History:   Social History     Social History    Marital status: Married     Spouse name: Ted    Number of children: 2    Years of education: N/A     Occupational History    Professor Physical Therapy      Social History Main Topics    Smoking status: Never Smoker    Smokeless tobacco: Never Used    Alcohol use 1.2 oz/week     2 Glasses of wine per week    Drug use: No    Sexual activity: Not Currently     Partners: Male     Birth control/ protection: IUD     Other Topics Concern    None     Social History Narrative    Married monogamous 1999         Review of Systems:    Review of Systems   Constitutional: Negative.    HENT: Negative.    Eyes:  Negative.    Respiratory: Negative.    Cardiovascular: Negative.    Gastrointestinal: Negative.    Genitourinary: Negative.    Musculoskeletal: Negative.    Skin: Negative.    Neurological: Negative.    Endo/Heme/Allergies: Negative.    Psychiatric/Behavioral: Negative.        Vital Signs:   Visit Vitals    BP 116/78    Pulse 74    Ht 1.702 m ( )    Wt 80.7 kg (178 lb)    BMI 27.88 kg/m2         PHYSICAL EXAM:  Physical Exam   Constitutional: She is oriented to person, place, and time. She  appears well-developed and well-nourished. No distress.   HENT:   Head: Normocephalic and atraumatic.   Right Ear: External ear normal.   Left Ear: External ear normal.   Nose: Nose normal.   Mouth/Throat: Oropharynx is clear and moist. No oropharyngeal exudate.   Eyes: Conjunctivae and EOM are normal. Pupils are equal, round, and reactive to light. Right eye exhibits no discharge. Left eye exhibits no discharge. No scleral icterus.   Neck: Normal range of motion. Neck supple. No JVD present. No tracheal deviation present. No thyromegaly present.   Cardiovascular: Normal rate, regular rhythm, normal heart sounds and intact distal pulses.  Exam reveals no gallop and no friction rub.    No murmur heard.  Pulmonary/Chest: Effort normal and breath sounds normal. No stridor. No respiratory distress. She has no wheezes. She has no rales. She exhibits no tenderness.   Abdominal: Soft. Bowel sounds are normal. She exhibits no distension and no mass. There is no tenderness. There is no rebound and no guarding.   Genitourinary:   Genitourinary Comments: Patient declined breast exam.  She states it's been done recently by her gynecologist.  Reminded of upcoming mammogram.   Musculoskeletal: Normal range of motion. She exhibits no edema, tenderness or deformity.   Lymphadenopathy:     She has no cervical adenopathy.   Neurological: She is alert and oriented to person, place, and time. She has normal reflexes. She displays normal  reflexes. No cranial nerve deficit. She exhibits normal muscle tone. Coordination normal.   Skin: Skin is warm and dry. No rash noted. She is not diaphoretic. No erythema. No pallor.   Psychiatric: She has a normal mood and affect. Her behavior is normal. Judgment and thought content normal.   Nursing note and vitals reviewed.    Rowing twice a week x 90 minutes  Runs once week 3-      Assessment:     1. Abnormal weight gain     2. Allergic Rhinitis     3. Liver function test abnormality     4. Routine general medical examination at a health care facility          Plan:        1.  Encouraged diet exercise weight reduction.  Weight loss of 10-20 pounds in the next year encouraged.  Discussed exercise as well as weight watchers  2.  Continue with seasonal use of an histamine and nasal steroids  3.  Recently normal labs.  Continue with efforts towards diet exercise weight reduction    Patient Instructions     Advise wear sunscreen and reapply frequently  Tetanus immunizations are required every 10 years or every 5 years if you have had an injury  Influenza vaccines are recommended yearly especially for those over 50  Pneumonia vaccines are recommended at age 65. Earlier for certain health conditions  Shingles vaccine is recommended at age 68  Please complete advance directives and healthcare proxy information and provide [place] with a copy  Mammograms are recommended typically yearly for women between the ages of 11-75  Colonoscopy is recommended starting at age 45 for colon cancer screening and earlier for some with family history or other risk factors.  Other options include sigmoidoscopy or yearly guaiac cards.  Advise limit alcohol intake to less than one to 2 drinks a day  Recommend exercise 3-5 times a week for 30-45 minutes, especially work on balance  Advise to wear safety equipment such as seatbelts or helmets  If  sexually active recommend safe sex practices   Advise 1000  mg of calcium a day and 1000 units  of vitamin D 3 between diet and supplement unless otherwise directed

## 2015-09-09 NOTE — Patient Instructions (Signed)
Advise wear sunscreen and reapply frequently  Tetanus immunizations are required every 10 years or every 5 years if you have had an injury  Influenza vaccines are recommended yearly especially for those over 50  Pneumonia vaccines are recommended at age 52. Earlier for certain health conditions  Shingles vaccine is recommended at age 60  Please complete advance directives and healthcare proxy information and provide us with a copy  Mammograms are recommended typically yearly for women between the ages of 40-75  Colonoscopy is recommended starting at age 50 for colon cancer screening and earlier for some with family history or other risk factors.  Other options include sigmoidoscopy or yearly guaiac cards.  Advise limit alcohol intake to less than one to 2 drinks a day  Recommend exercise 3-5 times a week for 30-45 minutes, especially work on balance  Advise to wear safety equipment such as seatbelts or helmets  If sexually active recommend safe sex practices   Advise 1000  mg of calcium a day and 1000 units of vitamin D 3 between diet and supplement unless otherwise directed

## 2015-10-14 NOTE — Telephone Encounter (Signed)
Ms. Almendariz is calling to cancel her appointment which is currently scheduled for 3/15 COD.    Has the appointment been cancelled? yes    Has the appointment been rescheduled? no    Does the patient need a call back to reschedule?yes    Patient can be contacted back at [phone]  Thank you.

## 2015-10-20 NOTE — Telephone Encounter (Signed)
Patient is calling back to reschedule her canceled COD from 3/15. Patient can be reached at [phone].

## 2015-10-21 NOTE — Telephone Encounter (Signed)
No COD schedules available at this time. Patient will be called back when schedule is available.

## 2015-11-02 ENCOUNTER — Other Ambulatory Visit: Payer: Self-pay | Admitting: Gastroenterology

## 2015-11-04 ENCOUNTER — Ambulatory Visit: Payer: Self-pay

## 2015-11-04 ENCOUNTER — Encounter: Payer: Self-pay | Admitting: Otolaryngology

## 2015-11-04 ENCOUNTER — Ambulatory Visit: Payer: Self-pay | Admitting: Otolaryngology

## 2015-11-04 VITALS — BP 118/80 | HR 64 | Temp 96.6°F | Ht 67.0 in | Wt 174.0 lb

## 2015-11-04 DIAGNOSIS — J309 Allergic rhinitis, unspecified: Secondary | ICD-10-CM

## 2015-11-04 DIAGNOSIS — J3489 Other specified disorders of nose and nasal sinuses: Secondary | ICD-10-CM

## 2015-11-04 DIAGNOSIS — J3089 Other allergic rhinitis: Secondary | ICD-10-CM

## 2015-11-04 DIAGNOSIS — J342 Deviated nasal septum: Secondary | ICD-10-CM

## 2015-11-04 NOTE — Progress Notes (Signed)
DEPARTMENT OF OTOLARYNGOLOGY    Today's Date: 11/04/2015     Chief Complaint: Recheck allergies    HPI:  Ezequiel GanserSuzanne Sgro is a 50 y.o. year old female seen in follow up with a history of perennial and seasonal allergic rhinitis.  Patient began allergy immunotherapy in September 2009 and discontinued in 2014. Prior to immunotherapy he main symptom was headaches. She had allergies to mold, dust, cat, dog, grass, trees and weeds.    Patient reports that she has had constant rhinitis this winter, nasal congestion, right sided aural fullness, sinus headaches. She has been using essential oils for treatment of allergies. She will occasionally take a zyrtec for symptoms. She is not taking steroid sprays. She does not irrigate. She has not had a sinus infection since she was last seen. She does not have a history of asthma. She has had occasional headaches but not nearly as bad as previously.         ROS:  Patient is taking Clarinex in the past as needed    Allergies:   Environmental allergies; No known drug allergy; and No known latex allergy    Active Medications:  Current Outpatient Prescriptions   Medication    Omega-3 Fatty Acids (FISH OIL PO)    evening primrose oil capsule    calcium carbonate-vitamin D 600-400 MG-UNIT per tablet    Multiple Vitamins-Minerals (MULTI COMPLETE PO)     No current facility-administered medications for this visit.        Electronic record reviewed for PMH, PSH    Physical Examination:  Healthy appearing 50 y.o. year old female  Blood pressure 118/80, pulse 64, temperature 35.9 C (96.6 F), height 1.702 m (5\' 7" ), weight 78.9 kg (174 lb).   Comprehensive ENT exam performed  EARS:external auditory canal patent bilaterally.  Both tympanic membranes are intact and mobile.  NOSE: Right anterior deviated septum. Membranes dry, mildly congested and erythematous.  ORAL:Oral mucosa is moist.  There are no oral cavity lesions.  Tongue has normal mobility.  Oropharyngeal exam reveals  nonobstructing tonsils.  The posterior pharyngeal wall is without erythema.  LARYNX: Not visualized  NECK: No adenopathy       Assessment:  Ezequiel GanserSuzanne Chiasson is a 50 year old female with history of allergic rhinitis who has been off of immunotherapy for just over 2 years and is having mild symptoms of allergy. She does have right DNS and dryness which are also contributing to her nasal congestion. I would recommend nasal saline spray throughout the day and cool midst humidification at night. She will come back for allergy testing today to help better understand if this is playing a role. I will review those results with her at that time.       Rosamaria LintsKristina C Tery Hoeger, NP as of 12:50 PM, 11/04/2015

## 2015-11-04 NOTE — Progress Notes (Signed)
Allergy Test Results  Patient Name: Diana Bell  Date of Birth: Sep 18, 1965  MRN: [id number]  Date of Testing: 11/04/2015    Reason for Visit:  Diana Bell is here today for Allergy Testing.  Prior to testing, the patient denied use of antihistamines for > 4 days.  She had prior skin testing done approximately 8 years ago and revealed positives to : mold, dustmites, grass, tree, weeds and had immunotherapy..  Today, the patient appeared stable. She does not have asthma. Her symptoms occur all year round, most notably in the winter, and include nasal congestion, rhinorrhea, morning sore throat, post nasal drainage and headache/sinus pressure.  She does not have a history of rhinosinusitis.  The patient does not have eczema.    Her occupation is PT professor at [employer].     She lives in a house [place] in [place]. The basement is unfinished and is damp. She  does run a Government social research officerdehumidifier. Visible mold  is not seen in the home. The home has carpeting, including in the bedroom. There is central air conditioning in the home. No pets. She denies medication and food allergies.  Medications the patient has been using for allergy control include: Zyrtec prn.    Intradermal skin testing methods were performed. Results are based on interpretation of #2 (mild allergy) to #6 (severe allergy).  Allergens include:  Aspergillus # 2  Mucor # 2  PCN mold # 2  Pullularia # 2  Cephalosporium # 2  Fusarium # 2  Epicoccum # 2  Phoma # 2  Stemphyllium # 2  Cladosporium # 2  Dustmite F # 5  Dustmite PT # 4  Cat # 3  Dog # 3  Pigweed # 2  Mugwort # 2  Maple # 2  Birch # 2    Histamine positive control, negative diluent control and glycerin control were used to validate skin testing response. Testing was tolerated well.    Patient watched the educational video on how to reduce exposure to offending allergens.    Patient was given written information on how to make these changes as well as cross reactive and mold ferment foods.    Salome ArntK. Haralambides, NP  reviewed the results with the patient. She will make environmental changes, use saline spray and schedule a follow up for 3 months.

## 2015-11-21 ENCOUNTER — Other Ambulatory Visit: Payer: Self-pay | Admitting: Gastroenterology

## 2015-11-21 LAB — HM MAMMOGRAPHY

## 2015-11-25 NOTE — Telephone Encounter (Signed)
Called patient left VM. Patient can be rescheduled next available any provider.

## 2015-11-28 NOTE — Telephone Encounter (Signed)
Patient is rescheduled for COD on 8/7 at 2:00pm [facility] Please mail new appointment info to patient at the address on file.    If questions, please call her at [phone].

## 2015-11-28 NOTE — Telephone Encounter (Signed)
Mailed prep instructions to patient.

## 2015-12-05 ENCOUNTER — Encounter: Payer: Self-pay | Admitting: Primary Care

## 2015-12-21 ENCOUNTER — Ambulatory Visit: Payer: Self-pay | Admitting: Primary Care

## 2015-12-21 ENCOUNTER — Encounter: Payer: Self-pay | Admitting: Primary Care

## 2015-12-21 VITALS — BP 110/70 | HR 66 | Temp 98.8°F | Ht 67.0 in | Wt 173.0 lb

## 2015-12-21 DIAGNOSIS — R202 Paresthesia of skin: Secondary | ICD-10-CM

## 2015-12-21 DIAGNOSIS — R21 Rash and other nonspecific skin eruption: Secondary | ICD-10-CM

## 2015-12-21 DIAGNOSIS — R600 Localized edema: Secondary | ICD-10-CM

## 2015-12-21 LAB — CBC AND DIFFERENTIAL
Baso # K/uL: 0 10*3/uL (ref 0.0–0.1)
Basophil %: 0.3 %
Eos # K/uL: 0.1 10*3/uL (ref 0.0–0.4)
Eosinophil %: 0.8 %
Hematocrit: 39 % (ref 34–45)
Hemoglobin: 12.7 g/dL (ref 11.2–15.7)
IMM Granulocytes #: 0 10*3/uL (ref 0.0–0.1)
IMM Granulocytes: 0.2 %
Lymph # K/uL: 2.2 10*3/uL (ref 1.2–3.7)
Lymphocyte %: 34.8 %
MCH: 29 pg/cell (ref 26–32)
MCHC: 33 g/dL (ref 32–36)
MCV: 87 fL (ref 79–95)
Mono # K/uL: 0.5 10*3/uL (ref 0.2–0.9)
Monocyte %: 7.3 %
Neut # K/uL: 3.6 10*3/uL (ref 1.6–6.1)
Nucl RBC # K/uL: 0 10*3/uL (ref 0.0–0.0)
Nucl RBC %: 0 /100 WBC (ref 0.0–0.2)
Platelets: 244 10*3/uL (ref 160–370)
RBC: 4.4 MIL/uL (ref 3.9–5.2)
RDW: 12.6 % (ref 11.7–14.4)
Seg Neut %: 56.6 %
WBC: 6.3 10*3/uL (ref 4.0–10.0)

## 2015-12-21 LAB — BASIC METABOLIC PANEL
Anion Gap: 11 (ref 7–16)
CO2: 28 mmol/L (ref 20–28)
Calcium: 9.4 mg/dL (ref 8.6–10.2)
Chloride: 103 mmol/L (ref 96–108)
Creatinine: 0.9 mg/dL (ref 0.51–0.95)
GFR,Black: 85 *
GFR,Caucasian: 74 *
Glucose: 90 mg/dL (ref 60–99)
Lab: 17 mg/dL (ref 6–20)
Potassium: 4.4 mmol/L (ref 3.3–5.1)
Sodium: 142 mmol/L (ref 133–145)

## 2015-12-21 LAB — SEDIMENTATION RATE, AUTOMATED: Sedimentation Rate: 5 mm/hr (ref 0–30)

## 2015-12-21 LAB — CRP: CRP: 3 mg/L (ref 0–10)

## 2015-12-21 LAB — TSH: TSH: 2.05 u[IU]/mL (ref 0.27–4.20)

## 2015-12-21 LAB — VITAMIN B12: Vitamin B12: 469 pg/mL (ref 211–946)

## 2015-12-21 NOTE — Progress Notes (Signed)
Patient ID: Diana Bell is a 50 y.o. year old female.    Chief Complaint:   Chief Complaint   Patient presents with    Rash       History of Present Illness     Diana Bell is a 50 y.o. year old female who presents today for followup of     A rash on her lower extremities that she states it's been going on for about 2 weeks.  No new laundry detergent clothing medication creams etc.  She has taken antihistamine in using a topical steroid cream which keeps things controlled.  Today she has noted there appears to be swelling in her feet and her legs.    Also complains of a burning sensation in the area of the rash below her knees but above the ankles    Does have a history of allergies had been on allergy injections until a few years ago. States she is allergic to "everything"  No shortness of breath or wheezing.    Did use new dryer sheets just before the onset of this.  Has not been using them for a few weeks and the rash persists.    No joint symptoms.  No recent URI.  No fever or chills.  Otherwise feels well.  Energy level normal      Past Medical History:  Past Medical History:   Diagnosis Date    Allergic rhinitis     Elevated LFTs     GERD (gastroesophageal reflux disease)     Raynaud disease        Past Surgical History:  No past surgical history on file.    Review of Systems:  As per HPI    Allergy:   Reviewed with patient at time of visit  Allergies   Allergen Reactions    Environmental Allergies     No Known Drug Allergy     No Known Latex Allergy        Medications:  Reviewed with patient at time of visit  Current Outpatient Prescriptions   Medication Sig    diphenhydrAMINE (BENADRYL) 25 MG tablet Take 25 mg by mouth every 6 hours as needed    hydrocortisone acetate 1 % CREA Apply topically 2 times daily    Omega-3 Fatty Acids (FISH OIL PO) Take by mouth    evening primrose oil capsule Take by mouth daily    calcium carbonate-vitamin D 600-400 MG-UNIT per tablet Take 1 tablet by mouth  daily    Multiple Vitamins-Minerals (MULTI COMPLETE PO) Take by mouth     No current facility-administered medications for this visit.        Social History:  Reviewed with patient at time of visit  Social History   Substance Use Topics    Smoking status: Never Smoker    Smokeless tobacco: Never Used    Alcohol use 1.2 oz/week     2 Glasses of wine per week         Physical Exam:     Visit Vitals    BP 110/70    Pulse 66    Temp 37.1 C (98.8 F) (Temporal)    Ht 1.702 m (5\' 7" )    Wt 78.5 kg (173 lb)    BMI 27.1 kg/m2       GEN: No acute distress  Well nourished and well developed  EYE: anicteric   SKIN: warm and dry, faint nonraised papular rash below her knees bilaterally  MSS: moving all extremities.  Perhaps subtle swelling nonpitting edema feet and legs  NEURO: Nonfocal.  Gross sensory testing normal      Recent Lab Results:  Lab Results   Component Value Date    NA 141 07/25/2015    K 5.1 07/25/2015    CL 103 07/25/2015    CO2 26 07/25/2015    UN 18 07/25/2015    CREAT 0.94 07/25/2015    VID25 31 07/25/2015    WBC 6.4 07/25/2015    HGB 12.8 07/25/2015    HCT 39 07/25/2015    PLT 253 07/25/2015    TSH 2.94 07/25/2015    CHOL 178 07/25/2015    TRIG 53 07/25/2015    HDL 56 07/25/2015    LDLC 161 07/25/2015    CHHDC 3.2 07/25/2015       Assessment and Plan:     Lower extremity rash, lower extremity paresthesias and edema.  Suspect she might have mild contact dermatitis to unknown substance.  Plan is to check inflammatory markers, B-12.  If these are normal steroid taper for one week.  If symptoms persist despite this referred to dermatology or rheumatology depending on results of lab tests.

## 2015-12-22 ENCOUNTER — Encounter: Payer: Self-pay | Admitting: Primary Care

## 2015-12-22 ENCOUNTER — Other Ambulatory Visit: Payer: Self-pay | Admitting: Primary Care

## 2015-12-22 MED ORDER — METHYLPREDNISOLONE 4 MG PO TBPK *A*
ORAL_TABLET | ORAL | 0 refills | Status: DC
Start: 2015-12-22 — End: 2016-09-10

## 2016-01-11 ENCOUNTER — Telehealth: Payer: Self-pay | Admitting: Otolaryngology

## 2016-01-11 NOTE — Telephone Encounter (Signed)
Sent parent/patient a MyChart message in regards to the bumped appointment. Asked patient/parent to call back or send a MyChart message to confirm the change in appointment.

## 2016-02-09 ENCOUNTER — Telehealth: Payer: Self-pay | Admitting: Otolaryngology

## 2016-02-09 NOTE — Telephone Encounter (Signed)
APPT CANCELLED 

## 2016-02-09 NOTE — Telephone Encounter (Signed)
Patient called to cancel her appointment on 6/27

## 2016-02-10 ENCOUNTER — Ambulatory Visit: Payer: Self-pay | Admitting: Otolaryngology

## 2016-02-14 ENCOUNTER — Ambulatory Visit: Payer: Self-pay | Admitting: Otolaryngology

## 2016-03-14 ENCOUNTER — Telehealth: Payer: Self-pay | Admitting: Gastroenterology

## 2016-03-14 NOTE — Telephone Encounter (Signed)
Sent prep.

## 2016-03-14 NOTE — Telephone Encounter (Signed)
Patient calling to get her COD prep instructions via Mychart. She is scheduled for 8/7 with Dr Renaldo Reel.     If needed, patient can be reached at [phone]

## 2016-03-21 ENCOUNTER — Encounter: Payer: Self-pay | Admitting: Gastroenterology

## 2016-03-23 ENCOUNTER — Telehealth: Payer: Self-pay

## 2016-03-23 NOTE — Telephone Encounter (Signed)
Called patient to confirm upcoming appointment for 03/26/2016 at 1:15PM at [facility] location and to make sure prep was received. Left VM to call back

## 2016-03-26 ENCOUNTER — Ambulatory Visit
Admission: RE | Admit: 2016-03-26 | Discharge: 2016-03-26 | Disposition: A | Discharge status: Home / Self Care | Payer: Self-pay | Attending: Gastroenterology | Admitting: Gastroenterology

## 2016-03-26 ENCOUNTER — Encounter: Payer: Self-pay | Admitting: Gastroenterology

## 2016-03-26 LAB — HM COLONOSCOPY

## 2016-03-26 MED ORDER — FENTANYL CITRATE 50 MCG/ML IJ SOLN *WRAPPED*
INTRAMUSCULAR | Status: AC
Start: 2016-03-26 — End: 2016-03-26
  Filled 2016-03-26: qty 4

## 2016-03-26 MED ORDER — SODIUM CHLORIDE 0.9 % IV SOLN WRAPPED *I*
100.0000 mL/h | Status: DC
Start: 2016-03-26 — End: 2016-03-27

## 2016-03-26 MED ORDER — MIDAZOLAM HCL 5 MG/5ML IJ SOLN *I*
INTRAMUSCULAR | Status: AC
Start: 2016-03-26 — End: 2016-03-26
  Filled 2016-03-26: qty 10

## 2016-03-26 NOTE — Discharge Instructions (Signed)
Gastroenterology Unit  Discharge Instructions for Colonoscopy      03/26/2016    2:48 PM    Colonoscopy     Return to your usual diet   Return to taking your usual medications    Things you may expect:   A small amount of bright red blood in your stool   It may be a few days before you have a bowel movement   You may have cramping, bloating, and feelings of "gas". These feelings should go away as you pass gas. If you still feel uncomfortable, walking around will help to pass the gas.   You were given medication to help you relax during the test. You may feel "fuzzy" and drowsy. Go home and rest for at least 4-6 hours.    You should call your doctor for any of the following:   Bad stomach pain   Fever   Bright red bleeding or clots (This may happen up to 20 days after the test.)   Dizziness or weakness that gets worse or lasts up to 24 hours.   Pain or redness at the IV site    If you have a serious problem after hours, Call [phone] to reach the GI physician on call. If you are unable to reach your doctor, [facility].    Follow Up Care:   Report will be sent to your primary doctor.   Repeat exam in  10 year(s)    New Prescriptions    No medications on file

## 2016-03-26 NOTE — Procedures (Signed)
Colonoscopy Procedure Note   Date of Procedure: 03/26/2016   Referring Physician: Collier FlowersWegman, Susan J, DO  Primary Physician: Collier FlowersWegman, Susan J, DO        Attending Physician: Flo ShanksJonathan Leilanny Fluitt, DO  Fellow:   None  Indications:  Colorectal cancer screening-average risk  Previous colonoscopy: No  Medications: none were administered    Scopes: [id number]    Procedure Details: Full disclosures of risks were reviewed with patient as detailed on the consent form. The patient was placed in the left lateral decubitus position and monitored with continuous pulse oximetry, interval blood pressure monitoring and direct observations.   After anorectal examination was performed, the colonoscope was inserted into the rectum and advanced under direct vision to the terminal ileum. The procedure was considered not difficult.  During withdrawal examination, the final quality of the prep was [facility] Bow[facility] Scale:  Right Colon: Grade3- (entire mucosa of colon segment seen well, with no residual staining, small fragments of stool, or opaque liquid)  Transverse Colon: Grade 3- (entire mucosa of colon segment seen well, with no residual staining, small fragments of stool, or opaque liquid)  Left Colon: Grade 3- (entire mucosa of colon segment seen well, with no residual staining, small fragments of stool, or opaque liquid)  A careful inspection was made as the colonoscope was withdrawn. A retroflexed view of the rectum was performed; findings and interventions are described below. The patient was recovered in the GI recovery area.     Scope Times:     Insertion Time: 1503  Cecum Time: 1509  Exit Time: 1520    Findings:  Anorectal:   Normal tone, no masses  Terminal Ileum:   Normal ileal mucosa  Cecum:   Normal mucosa  Ascending Colon:   Diverticulosis: rare, small in size  Transverse Colon:   Normal mucosa  Descending Colon:   Normal mucosa  Sigmoid:   Diverticulosis: rare, small in size    Rectum:   Normal rectum  throughout      Intervention(s):      None      Complication(s):     none    Impression(s):     Diverticulosis, located in the ascending colon and sigmoid .    Recommendation(s):     Repeat colonoscopy in 10 years.   Follow up with primary care physician.    Histopathologic Diagnosis:  none    Flo ShanksJonathan Leviticus Harton, DO

## 2016-03-26 NOTE — Preop H&P (Signed)
OUTPATIENT PRE-PROCEDURE H&P    Chief Complaint / Indications for Procedure: colon screening    Past Medical History:     Past Medical History:   Diagnosis Date    Allergic rhinitis     Elevated LFTs     GERD (gastroesophageal reflux disease)     Raynaud disease      History reviewed. No pertinent surgical history.  Family History   Problem Relation Age of Onset    Heart Disease Mother     Hypertension Mother     Anesth problems Mother     Cancer Father     Colon polyps Father     No Known Problems Brother     Colon cancer Paternal Grandmother      84's    No Known Problems Daughter     No Known Problems Daughter     Malignant Hypertension Neg Hx     Hypotension Neg Hx     Malignant Hyperthermia Neg Hx     Pseudochol deficiency Neg Hx     Celiac disease Neg Hx     Cirrhosis Neg Hx     Crohn's disease Neg Hx     Cystic fibrosis Neg Hx     Esophageal cancer Neg Hx     Hemochromatosis Neg Hx     Inflam bowel dis Neg Hx     Irritable bowel syndrome Neg Hx     Liver cancer Neg Hx     Liver Disease Neg Hx     Pancreatic Cancer Neg Hx     Rectal cancer Neg Hx     Stomach cancer Neg Hx     Ulcerative colitis Neg Hx     Wilson's disease Neg Hx      Social History     Social History    Marital status: Married     Spouse name: Ted    Number of children: 2    Years of education: N/A     Occupational History    Professor Physical Therapy      Social History Main Topics    Smoking status: Never Smoker    Smokeless tobacco: Never Used    Alcohol use 1.2 oz/week     2 Glasses of wine per week    Drug use: No    Sexual activity: Not Currently     Partners: Male     Birth control/ protection: IUD     Other Topics Concern    None     Social History Narrative    Married monogamous 1999       Allergies:    Allergies   Allergen Reactions    Environmental Allergies     No Known Drug Allergy     No Known Latex Allergy        Medications:    (Not in a hospital admission)    Current Outpatient  Prescriptions   Medication    diphenhydrAMINE (BENADRYL) 25 MG tablet    methylPREDNISolone (MEDROL PAK) 4 MG tablet pack    hydrocortisone acetate 1 % CREA    Omega-3 Fatty Acids (FISH OIL PO)    evening primrose oil capsule    calcium carbonate-vitamin D 600-400 MG-UNIT per tablet    Multiple Vitamins-Minerals (MULTI COMPLETE PO)     Current Facility-Administered Medications   Medication Dose Route Frequency    sodium chloride 0.9 % IV  100 mL/hr Intravenous Continuous     Vitals:    03/26/16 1359  BP: 118/90   Pulse: 58   Resp: 16   Temp: 36.7 C (98.1 F)   Weight: 74.8 kg (165 lb)   Height: 170.2 cm (5\' 7" )       Review of Systems   Gastrointestinal: Negative for abdominal pain and vomiting.   All other systems reviewed and are negative.      Physical Examination:  Head/Nose/Throat:negative  Lungs:Lungs clear  Cardiovascular:normal S1 and S2  Abdomen: abdomen soft, non-tender, nondistended, normal active bowel sounds, no masses or organomegaly      Lab Results: none    Radiology impressions (last 30 days):  No results found.    Currently Active Problems:  Patient Active Problem List   Diagnosis Code    Allergy To Cats Z91.09    Abnormal Weight Gain R63.5    Allergic Rhinitis J30.9    Allergic Rhinitis Due To Dust Mite J30.89    Liver function test abnormality R79.89        Impression:  Screening colonoscopy    Plan:  Colonoscopy  Moderate Sedation    UPDATES TO PATIENT'S CONDITION on the DAY OF SURGERY/PROCEDURE    I. Updates to Patient's Condition (to be completed by a provider privileged to complete a H&P, following reassessment of the patient by the provider):    Full H&P done today; no updates needed.    II. Procedure Readiness   I have reviewed the patient's H&P and updated condition. By completing and signing this form, I attest that this patient is ready for surgery/procedure.      III. Attestation   I have reviewed the updated information regarding the patient's condition and it is  appropriate to proceed with the planned surgery/procedure.    Flo ShanksJonathan Smt Lokey, DO as of 2:46 PM 03/26/2016

## 2016-03-27 ENCOUNTER — Encounter: Payer: Self-pay | Admitting: Primary Care

## 2016-09-10 ENCOUNTER — Ambulatory Visit: Payer: Self-pay | Admitting: Primary Care

## 2016-09-10 ENCOUNTER — Encounter: Payer: Self-pay | Admitting: Primary Care

## 2016-09-10 VITALS — BP 120/86 | HR 78 | Ht 67.0 in | Wt 173.0 lb

## 2016-09-10 DIAGNOSIS — Z0000 Encounter for general adult medical examination without abnormal findings: Secondary | ICD-10-CM

## 2016-09-10 DIAGNOSIS — Z7184 Encounter for health counseling related to travel: Secondary | ICD-10-CM

## 2016-09-10 DIAGNOSIS — Z780 Asymptomatic menopausal state: Secondary | ICD-10-CM

## 2016-09-10 DIAGNOSIS — R7989 Other specified abnormal findings of blood chemistry: Secondary | ICD-10-CM

## 2016-09-10 NOTE — H&P (Signed)
History and Physical    HISTORY:  Chief Complaint   Patient presents with    Annual Exam         History of Present Illness:    HPI see ROS    Problems:  Patient Active Problem List   Diagnosis Code    Allergy To Cats Z91.09    Abnormal Weight Gain R63.5    Allergic Rhinitis J30.9    Allergic Rhinitis Due To Dust Mite J30.89    Liver function test abnormality R79.89        Past Medical/Surgical History:   Past Medical History:   Diagnosis Date    Allergic rhinitis     Elevated LFTs     GERD (gastroesophageal reflux disease)     Raynaud disease      No past surgical history on file.      Allergies:    Allergies   Allergen Reactions    Environmental Allergies     No Known Drug Allergy     No Known Latex Allergy        Current medications:    Current Outpatient Prescriptions   Medication Sig    Omega-3 Fatty Acids (FISH OIL PO) Take by mouth    evening primrose oil capsule Take by mouth daily    calcium carbonate-vitamin D 600-400 MG-UNIT per tablet Take 1 tablet by mouth daily    Multiple Vitamins-Minerals (MULTI COMPLETE PO) Take by mouth       Family History:    Family History   Problem Relation Age of Onset    Heart Disease Mother     Hypertension Mother     Anesth problems Mother     Cancer Father     Colon polyps Father     No Known Problems Brother     Colon cancer Paternal Grandmother      29's    No Known Problems Daughter     No Known Problems Daughter     Malignant Hypertension Neg Hx     Hypotension Neg Hx     Malignant Hyperthermia Neg Hx     Pseudochol deficiency Neg Hx     Celiac disease Neg Hx     Cirrhosis Neg Hx     Crohn's disease Neg Hx     Cystic fibrosis Neg Hx     Esophageal cancer Neg Hx     Hemochromatosis Neg Hx     Inflam bowel dis Neg Hx     Irritable bowel syndrome Neg Hx     Liver cancer Neg Hx     Liver Disease Neg Hx     Pancreatic Cancer Neg Hx     Rectal cancer Neg Hx     Stomach cancer Neg Hx     Ulcerative colitis Neg Hx     Wilson's  disease Neg Hx        Social/Occupational History:   Social History     Social History    Marital status: Married     Spouse name: Ted    Number of children: 2    Years of education: N/A     Occupational History    Professor Physical Therapy      Social History Main Topics    Smoking status: Never Smoker    Smokeless tobacco: Never Used    Alcohol use 1.2 oz/week     2 Glasses of wine per week    Drug use: No    Sexual activity: Not Currently  Partners: Male     Birth control/ protection: IUD     Other Topics Concern    Not on file     Social History Narrative    Married monogamous 1999         Review of Systems:    Review of Systems   Constitutional: Negative.    HENT: Negative.    Eyes: Negative.    Respiratory: Negative.    Cardiovascular: Negative.    Gastrointestinal: Negative.    Genitourinary: Negative.    Musculoskeletal: Negative.    Skin: Negative.    Neurological: Negative.    Endo/Heme/Allergies: Negative.        Vital Signs:   BP 120/86 (BP Location: Right arm, Patient Position: Sitting, Cuff Size: adult)   Pulse 78   Ht 1.702 m (5\' 7" )   Wt 78.5 kg (173 lb)   SpO2 99%   BMI 27.1 kg/m2      PHYSICAL EXAM:  Physical Exam   Constitutional: She is oriented to person, place, and time. She appears well-developed and well-nourished. No distress.   HENT:   Head: Normocephalic and atraumatic.   Right Ear: External ear normal.   Left Ear: External ear normal.   Nose: Nose normal.   Mouth/Throat: Oropharynx is clear and moist. No oropharyngeal exudate.   Eyes: Conjunctivae and EOM are normal. Pupils are equal, round, and reactive to light. Right eye exhibits no discharge. Left eye exhibits no discharge. No scleral icterus.   Neck: Normal range of motion. Neck supple. No JVD present. No tracheal deviation present. No thyromegaly present.   Cardiovascular: Normal rate, regular rhythm, normal heart sounds and intact distal pulses.  Exam reveals no gallop and no friction rub.    No murmur  heard.  Pulmonary/Chest: Effort normal and breath sounds normal. No stridor. No respiratory distress. She has no wheezes. She has no rales. She exhibits no tenderness.   Abdominal: Soft. Bowel sounds are normal. She exhibits no distension and no mass. There is no tenderness. There is no rebound and no guarding.   Genitourinary:   Genitourinary Comments: Breasts exam done by GYN.    GYN does pelvic exam   Musculoskeletal: Normal range of motion. She exhibits no edema, tenderness or deformity.   Lymphadenopathy:     She has no cervical adenopathy.   Neurological: She is alert and oriented to person, place, and time. She has normal reflexes. No cranial nerve deficit. Coordination normal.   Skin: Skin is warm and dry. No rash noted. She is not diaphoretic. No erythema. No pallor.   Psychiatric: She has a normal mood and affect. Her behavior is normal. Judgment and thought content normal.   Nursing note and vitals reviewed.          Assessment:     1. Routine general medical examination at a health care facility     2. Liver function test abnormality  Hepatic function panel   3. Post-menopause  Basic metabolic panel    Lipid add Rfx to Drt LDL if Trig >400    CBC and differential    TSH    Vitamin D   4. Travel advice encounter       Plan:      1.  Exam done  2.  Continue with periodic recheck of liver function tests.  Most recent have normalized  3.  Discuss getting a DEXA scan and checking lab work.  Will discuss with gynecologist  4.  Recommended passport to health  for anticipated travel to [place] and [place].  Also recommended getting up to date with Tdap, flu shot, hepatitis A and B    Patient Instructions       Advise wear sunscreen and reapply frequently  Tetanus immunizations are required every 10 years or every 5 years if you have had an injury  Influenza vaccines are recommended yearly especially for those over 50  Pneumonia vaccines are recommended at age 80. Earlier for certain health  conditions  Shingles vaccine is recommended at age 14  Please complete advance directives and healthcare proxy information and provide [place] with a copy  Mammograms are recommended typically yearly for women between the ages of 57-75  Colonoscopy is recommended starting at age 66 for colon cancer screening and earlier for some with family history or other risk factors.  Other options include sigmoidoscopy or yearly guaiac cards.  Advise limit alcohol intake to less than one to 2 drinks a day  Recommend exercise 3-5 times a week for 30-45 minutes, especially work on balance  Advise to wear safety equipment such as seatbelts or helmets  If sexually active recommend safe sex practices   Advise 1000  mg of calcium a day and 1000 units of vitamin D 3 between diet and supplement unless otherwise directed

## 2016-09-10 NOTE — Patient Instructions (Signed)
Advise wear sunscreen and reapply frequently  Tetanus immunizations are required every 10 years or every 5 years if you have had an injury  Influenza vaccines are recommended yearly especially for those over 50  Pneumonia vaccines are recommended at age 53. Earlier for certain health conditions  Shingles vaccine is recommended at age 60  Please complete advance directives and healthcare proxy information and provide us with a copy  Mammograms are recommended typically yearly for women between the ages of 40-75  Colonoscopy is recommended starting at age 50 for colon cancer screening and earlier for some with family history or other risk factors.  Other options include sigmoidoscopy or yearly guaiac cards.  Advise limit alcohol intake to less than one to 2 drinks a day  Recommend exercise 3-5 times a week for 30-45 minutes, especially work on balance  Advise to wear safety equipment such as seatbelts or helmets  If sexually active recommend safe sex practices   Advise 1000  mg of calcium a day and 1000 units of vitamin D 3 between diet and supplement unless otherwise directed

## 2016-11-20 ENCOUNTER — Other Ambulatory Visit: Payer: Self-pay | Admitting: Gastroenterology

## 2016-11-20 LAB — HM MAMMOGRAPHY

## 2016-12-03 ENCOUNTER — Encounter: Payer: Self-pay | Admitting: Primary Care

## 2017-01-09 ENCOUNTER — Encounter: Payer: Self-pay | Admitting: Gastroenterology

## 2017-02-27 ENCOUNTER — Other Ambulatory Visit: Payer: Self-pay | Admitting: Obstetrics

## 2017-02-27 LAB — FOLLICLE STIMULATING HORMONE: FSH: 83.8 m[IU]/mL

## 2017-05-21 ENCOUNTER — Other Ambulatory Visit: Payer: Self-pay | Admitting: Gastroenterology

## 2017-09-02 ENCOUNTER — Encounter: Payer: Self-pay | Admitting: Gastroenterology

## 2017-09-03 ENCOUNTER — Other Ambulatory Visit: Payer: Self-pay | Admitting: Primary Care

## 2017-09-03 DIAGNOSIS — Z0000 Encounter for general adult medical examination without abnormal findings: Secondary | ICD-10-CM

## 2017-09-04 ENCOUNTER — Telehealth: Payer: Self-pay | Admitting: Primary Care

## 2017-09-04 NOTE — Telephone Encounter (Signed)
-----   Message from Henreitta LeberKaren Ehrhardt, LPN sent at 9/81/19141/15/2019  4:11 PM EST -----  Regarding: RE: Labs for HAP  Fasting labs ordered    ----- Message -----  From: Shelbie Hutchingharvella, Shyleigh Daughtry  Sent: 09/03/2017   3:48 PM  To: Henreitta LeberKaren Ehrhardt, LPN  Subject: Labs for HAP                                     Patient is scheduled for HAP with SW on 09/16/17.  Future labs will expire.  Please advise.

## 2017-09-04 NOTE — Telephone Encounter (Signed)
Left message reminding patient of upcoming HAP on 09/16/17 with SW and to do fasting labs prior to appt.

## 2017-09-13 ENCOUNTER — Other Ambulatory Visit
Admission: RE | Admit: 2017-09-13 | Discharge: 2017-09-13 | Disposition: A | Discharge status: Home / Self Care | Payer: BLUE CROSS/BLUE SHIELD | Source: Ambulatory Visit | Attending: Primary Care | Admitting: Primary Care

## 2017-09-13 DIAGNOSIS — R945 Abnormal results of liver function studies: Secondary | ICD-10-CM | POA: Insufficient documentation

## 2017-09-13 DIAGNOSIS — Z780 Asymptomatic menopausal state: Secondary | ICD-10-CM

## 2017-09-13 DIAGNOSIS — R7989 Other specified abnormal findings of blood chemistry: Secondary | ICD-10-CM

## 2017-09-13 DIAGNOSIS — Z0000 Encounter for general adult medical examination without abnormal findings: Secondary | ICD-10-CM | POA: Insufficient documentation

## 2017-09-13 LAB — CBC AND DIFFERENTIAL
Baso # K/uL: 0 10*3/uL (ref 0.0–0.1)
Basophil %: 0.2 %
Eos # K/uL: 0 10*3/uL (ref 0.0–0.4)
Eosinophil %: 0.7 %
Hematocrit: 45 % (ref 34–45)
Hemoglobin: 14.2 g/dL (ref 11.2–15.7)
IMM Granulocytes #: 0 10*3/uL
IMM Granulocytes: 0.3 %
Lymph # K/uL: 2 10*3/uL (ref 1.2–3.7)
Lymphocyte %: 34.6 %
MCH: 28 pg/cell (ref 26–32)
MCHC: 31 g/dL — ABNORMAL LOW (ref 32–36)
MCV: 89 fL (ref 79–95)
Mono # K/uL: 0.6 10*3/uL (ref 0.2–0.9)
Monocyte %: 9.7 %
Neut # K/uL: 3.2 10*3/uL (ref 1.6–6.1)
Nucl RBC # K/uL: 0 10*3/uL (ref 0.0–0.0)
Nucl RBC %: 0 /100 WBC (ref 0.0–0.2)
Platelets: 138 10*3/uL — ABNORMAL LOW (ref 160–370)
RBC: 5.1 MIL/uL (ref 3.9–5.2)
RDW: 11.9 % (ref 11.7–14.4)
Seg Neut %: 54.5 %
WBC: 5.9 10*3/uL (ref 4.0–10.0)

## 2017-09-13 LAB — LIPID PANEL
Chol/HDL Ratio: 3.3
Cholesterol: 208 mg/dL — AB
HDL: 64 mg/dL
LDL Calculated: 129 mg/dL
Non HDL Cholesterol: 144 mg/dL
Triglycerides: 77 mg/dL

## 2017-09-13 LAB — COMPREHENSIVE METABOLIC PANEL
ALT: 20 U/L (ref 0–35)
AST: 27 U/L (ref 0–35)
Albumin: 4.6 g/dL (ref 3.5–5.2)
Alk Phos: 98 U/L (ref 35–105)
Anion Gap: 10 (ref 7–16)
Bilirubin,Total: 0.4 mg/dL (ref 0.0–1.2)
CO2: 30 mmol/L — ABNORMAL HIGH (ref 20–28)
Calcium: 10 mg/dL (ref 8.6–10.2)
Chloride: 102 mmol/L (ref 96–108)
Creatinine: 0.96 mg/dL — ABNORMAL HIGH (ref 0.51–0.95)
GFR,Black: 78 *
GFR,Caucasian: 68 *
Glucose: 83 mg/dL (ref 60–99)
Lab: 20 mg/dL (ref 6–20)
Potassium: 4.5 mmol/L (ref 3.3–5.1)
Sodium: 142 mmol/L (ref 133–145)
Total Protein: 7.1 g/dL (ref 6.3–7.7)

## 2017-09-13 LAB — BILIRUBIN, DIRECT: Bilirubin,Direct: <0.2 mg/dL (ref 0.0–0.3)

## 2017-09-13 LAB — HEMOGLOBIN A1C: Hemoglobin A1C: 5.8 % — ABNORMAL HIGH

## 2017-09-16 ENCOUNTER — Ambulatory Visit: Payer: BLUE CROSS/BLUE SHIELD | Attending: Primary Care | Admitting: Primary Care

## 2017-09-16 ENCOUNTER — Encounter: Payer: Self-pay | Admitting: Primary Care

## 2017-09-16 VITALS — BP 114/80 | HR 68 | Ht 67.0 in | Wt 176.0 lb

## 2017-09-16 DIAGNOSIS — D696 Thrombocytopenia, unspecified: Secondary | ICD-10-CM

## 2017-09-16 DIAGNOSIS — E785 Hyperlipidemia, unspecified: Secondary | ICD-10-CM

## 2017-09-16 DIAGNOSIS — Z0000 Encounter for general adult medical examination without abnormal findings: Secondary | ICD-10-CM

## 2017-09-16 NOTE — H&P (Signed)
History and Physical    HISTORY:  Chief Complaint   Patient presents with    Annual Exam    Lab Results         History of Present Illness:    Questions about lab results, no acute concerns.   Diet changed because of her husband, now have limited carbs. Part of competitive rowing team and exercises such as running.  Lots of stressors at work and denied tenure. Taking control where she can.   Wants to donate platelets but counts are low and went to [place] recently. Had to take anti-malarial medications, unsure of name.         Problems:  Patient Active Problem List   Diagnosis Code    Allergy To Cats Z91.09    Abnormal Weight Gain R63.5    Allergic Rhinitis J30.9    Allergic Rhinitis Due To Dust Mite J30.89    Liver function test abnormality R94.5        Past Medical/Surgical History:   Past Medical History:   Diagnosis Date    Allergic rhinitis     Elevated LFTs     Raynaud disease      No past surgical history on file.      Allergies:    Allergies   Allergen Reactions    Environmental Allergies     No Known Drug Allergy     No Known Latex Allergy        Current medications:    Current Outpatient Prescriptions   Medication Sig    evening primrose oil capsule Take by mouth daily    Multiple Vitamins-Minerals (MULTI COMPLETE PO) Take by mouth       Family History:    Family History   Problem Relation Age of Onset    Heart Disease Mother     Hypertension Mother     Anesth problems Mother     Anxiety disorder Mother     Depression Mother     Cancer Father     Colon polyps Father     No Known Problems Brother     Colon cancer Paternal Grandmother         76's    No Known Problems Daughter     No Known Problems Daughter     Developmental Delay Sister     Diverticulitis Sister     Malignant Hypertension Neg Hx     Hypotension Neg Hx     Malignant Hyperthermia Neg Hx     Pseudochol deficiency Neg Hx     Celiac disease Neg Hx     Cirrhosis Neg Hx     Crohn's disease Neg Hx     Cystic  fibrosis Neg Hx     Esophageal cancer Neg Hx     Hemochromatosis Neg Hx     Inflam bowel dis Neg Hx     Irritable bowel syndrome Neg Hx     Liver cancer Neg Hx     Liver Disease Neg Hx     Pancreatic Cancer Neg Hx     Rectal cancer Neg Hx     Stomach cancer Neg Hx     Ulcerative colitis Neg Hx     Wilson's disease Neg Hx        Social/Occupational History:   Social History     Social History    Marital status: Married     Spouse name: Ted    Number of children: 2    Years of education:  N/A     Occupational History    Professor Physical Therapy      Social History Main Topics    Smoking status: Never Smoker    Smokeless tobacco: Never Used    Alcohol use 1.2 oz/week     2 Glasses of wine per week    Drug use: No    Sexual activity: Not Currently     Partners: Male     Birth control/ protection: IUD     Other Topics Concern    None     Social History Narrative    Married monogamous 1999    Professor of Physical Therapy [employer]         Review of Systems:    Review of Systems   Constitutional: Negative for chills, diaphoresis and fever.   HENT: Negative for hearing loss, nosebleeds and tinnitus.    Eyes: Negative for blurred vision and double vision.   Respiratory: Negative for cough and shortness of breath.    Cardiovascular: Negative for palpitations and leg swelling.   Gastrointestinal: Negative for abdominal pain, blood in stool, constipation, diarrhea, heartburn, melena, nausea and vomiting.   Genitourinary: Negative for dysuria and hematuria.   Musculoskeletal: Negative for back pain, joint pain and myalgias.   Skin: Negative for rash.   Neurological: Negative for dizziness, tingling and headaches.   Endo/Heme/Allergies: Does not bruise/bleed easily.       Vital Signs:   BP 114/80    Pulse 68    Ht 1.702 m (5\' 7" )    Wt 79.8 kg (176 lb)    BMI 27.57 kg/m       PHYSICAL EXAM:  Physical Exam   Constitutional: She is oriented to person, place, and time. She appears well-developed and  well-nourished. No distress.   HENT:   Head: Normocephalic and atraumatic.   Right Ear: External ear normal.   Left Ear: External ear normal.   Mouth/Throat: Oropharynx is clear and moist.   Eyes: Conjunctivae are normal. Right eye exhibits no discharge. Left eye exhibits no discharge.   Neck: Normal range of motion. Neck supple.   Cardiovascular: Normal rate, regular rhythm and normal heart sounds.    Pulmonary/Chest: Effort normal and breath sounds normal. No respiratory distress. She has no wheezes.   Abdominal: Soft. Bowel sounds are normal. She exhibits no distension. There is no tenderness.   Genitourinary:   Genitourinary Comments: Normal breast exam without nodules   Musculoskeletal: Normal range of motion. She exhibits no edema, tenderness or deformity.   Neurological: She is alert and oriented to person, place, and time. She displays normal reflexes. Coordination normal.   Skin: Skin is warm and dry. No rash noted. She is not diaphoretic. No erythema.   Psychiatric: She has a normal mood and affect. Her behavior is normal.           Assessment:    Diana Bell was seen today for annual exam and lab results.    Diagnoses and all orders for this visit:    Encounter for routine history and physical exam in female    Thrombocytopenia    Hyperlipidemia, unspecified hyperlipidemia type    Healthcare maintenance       .      Plan:      1. Annual physical- No new concerns  2. Thrombocytopenia-No new bruising, bleeding, recent infections, NSAIDs, ASA. Likely lab error or 2/2 anti malarial medications. Will repeat in 3 months. If low, will refer to Hematology.   3.  HLD-ASCVD 1.2%, encourage lifestyle modifications  4. Healthcare maintenance:  -Up to date on mammogram (repeat annual MRI in 05/2018) and colonoscopy (done 03/2016, repeat in 10 years).   -Last pap smear in 2015 with neg HPV (she will reach out to [facility] for follow up). Up to date on dental exams.   -Educated about Shingrix vaccine but given lack of response  to varicella vaccine, will reach out to ID for further reccs.   -Patient will inform us of last tetanus vaccine.        Patient Instructions       Advise wear sunscreen and reapply frequently  Tetanus immunizations are required every 10 years or every 5 years if you have had an injury  Influenza vaccines are recommended yearly especially for those over 50  Pneumonia vaccines are recommended at age 9. Earlier for certain health conditions  Shingrix vaccine advised.  Check into insurance coverage and return or call for a prescription if desired.  Please complete advance directives and healthcare proxy information and provide [place] with a copy  Mammograms are recommended typically yearly for women between the ages of 59-75  Colonoscopy is recommended starting at age 76 for colon cancer screening and earlier for some with family history or other risk factors.  Other options include sigmoidoscopy, DNA test cologuard or guaiac cards.  Advise limit alcohol intake to less than one to 2 drinks a day  Eye exams every 1-2 years to check for glaucoma  Recommend exercise 3-5 times a week for 30-45 minutes, especially work on balance  Advise to wear safety equipment such as seatbelts or helmets  If sexually active recommend safe sex practices   Advise 1000  mg of calcium a day and 1000 units of vitamin D 3 between diet and supplement unless otherwise directed      The 10-year ASCVD risk score Denman George DC Montez Hageman., et al., 2013) is: 1.2%*    Values used to calculate the score:      Age: 30 years      Sex: Female      Is Non-Hispanic African American: No      Diabetic: No      Tobacco smoker: No      Systolic Blood Pressure: 114 mmHg      Is BP treated: No      HDL Cholesterol: 64 mg/dL*      Total Cholesterol: 208 mg/dL*      * - Cholesterol units were assumed for this score calculation      I have examined and interviewed this patient along with first year resident Dr. Allena Katz. I agree with her physical exam, assessment and plan.

## 2017-09-16 NOTE — Patient Instructions (Signed)
Advise wear sunscreen and reapply frequently  Tetanus immunizations are required every 10 years or every 5 years if you have had an injury  Influenza vaccines are recommended yearly especially for those over 50  Pneumonia vaccines are recommended at age 10765. Earlier for certain health conditions  Shingrix vaccine advised.  Check into insurance coverage and return or call for a prescription if desired.  Please complete advance directives and healthcare proxy information and provide us with a copy  Mammograms are recommended typically yearly for women between the ages of 1140-75  Colonoscopy is recommended starting at age 54 for colon cancer screening and earlier for some with family history or other risk factors.  Other options include sigmoidoscopy, DNA test cologuard or guaiac cards.  Advise limit alcohol intake to less than one to 2 drinks a day  Eye exams every 1-2 years to check for glaucoma  Recommend exercise 3-5 times a week for 30-45 minutes, especially work on balance  Advise to wear safety equipment such as seatbelts or helmets  If sexually active recommend safe sex practices   Advise 1000  mg of calcium a day and 1000 units of vitamin D 3 between diet and supplement unless otherwise directed      The 10-year ASCVD risk score Denman George(Goff DC Montez HagemanJr., et al., 2013) is: 1.2%*    Values used to calculate the score:      Age: [age] years      Sex: Female      Is Non-Hispanic African American: No      Diabetic: No      Tobacco smoker: No      Systolic Blood Pressure: 114 mmHg      Is BP treated: No      HDL Cholesterol: 64 mg/dL*      Total Cholesterol: 208 mg/dL*      * - Cholesterol units were assumed for this score calculation

## 2017-11-21 ENCOUNTER — Other Ambulatory Visit: Payer: Self-pay | Admitting: Gastroenterology

## 2017-11-21 LAB — HM MAMMOGRAPHY

## 2017-11-25 ENCOUNTER — Encounter: Payer: Self-pay | Admitting: Primary Care

## 2018-08-05 ENCOUNTER — Other Ambulatory Visit: Payer: Self-pay | Admitting: Gastroenterology

## 2018-08-26 ENCOUNTER — Other Ambulatory Visit: Payer: Self-pay | Admitting: Primary Care

## 2018-08-26 DIAGNOSIS — Z0000 Encounter for general adult medical examination without abnormal findings: Secondary | ICD-10-CM

## 2018-09-09 ENCOUNTER — Other Ambulatory Visit
Admission: RE | Admit: 2018-09-09 | Discharge: 2018-09-09 | Disposition: A | Discharge status: Home / Self Care | Payer: BLUE CROSS/BLUE SHIELD | Source: Ambulatory Visit | Attending: Primary Care | Admitting: Primary Care

## 2018-09-09 DIAGNOSIS — D696 Thrombocytopenia, unspecified: Secondary | ICD-10-CM | POA: Insufficient documentation

## 2018-09-09 DIAGNOSIS — Z0000 Encounter for general adult medical examination without abnormal findings: Secondary | ICD-10-CM | POA: Insufficient documentation

## 2018-09-09 LAB — CBC AND DIFFERENTIAL
Baso # K/uL: 0 10*3/uL (ref 0.0–0.1)
Basophil %: 0.3 %
Eos # K/uL: 0.1 10*3/uL (ref 0.0–0.4)
Eosinophil %: 0.9 %
Hematocrit: 42 % (ref 34–45)
Hemoglobin: 13.3 g/dL (ref 11.2–15.7)
IMM Granulocytes #: 0 10*3/uL
IMM Granulocytes: 0.2 %
Lymph # K/uL: 2 10*3/uL (ref 1.2–3.7)
Lymphocyte %: 34.4 %
MCH: 28 pg/cell (ref 26–32)
MCHC: 32 g/dL (ref 32–36)
MCV: 87 fL (ref 79–95)
Mono # K/uL: 0.5 10*3/uL (ref 0.2–0.9)
Monocyte %: 9 %
Neut # K/uL: 3.2 10*3/uL (ref 1.6–6.1)
Nucl RBC # K/uL: 0 10*3/uL (ref 0.0–0.0)
Nucl RBC %: 0 /100 WBC (ref 0.0–0.2)
Platelets: 166 10*3/uL (ref 160–370)
RBC: 4.8 MIL/uL (ref 3.9–5.2)
RDW: 12.6 % (ref 11.7–14.4)
Seg Neut %: 55.2 %
WBC: 5.8 10*3/uL (ref 4.0–10.0)

## 2018-09-09 LAB — LIPID PANEL
Chol/HDL Ratio: 3
Cholesterol: 189 mg/dL
HDL: 62 mg/dL — ABNORMAL HIGH (ref 40–60)
LDL Calculated: 105 mg/dL
Non HDL Cholesterol: 127 mg/dL
Triglycerides: 109 mg/dL

## 2018-09-09 LAB — COMPREHENSIVE METABOLIC PANEL
ALT: 11 U/L (ref 0–35)
AST: 13 U/L (ref 0–35)
Albumin: 4.2 g/dL (ref 3.5–5.2)
Alk Phos: 79 U/L (ref 35–105)
Anion Gap: 12 (ref 7–16)
Bilirubin,Total: 0.4 mg/dL (ref 0.0–1.2)
CO2: 26 mmol/L (ref 20–28)
Calcium: 9.6 mg/dL (ref 8.6–10.2)
Chloride: 102 mmol/L (ref 96–108)
Creatinine: 0.95 mg/dL (ref 0.51–0.95)
GFR,Black: 78 *
GFR,Caucasian: 68 *
Glucose: 93 mg/dL (ref 60–99)
Lab: 20 mg/dL (ref 6–20)
Potassium: 4.4 mmol/L (ref 3.3–5.1)
Sodium: 140 mmol/L (ref 133–145)
Total Protein: 6.7 g/dL (ref 6.3–7.7)

## 2018-09-09 LAB — HEMOGLOBIN A1C: Hemoglobin A1C: 5.7 % — ABNORMAL HIGH

## 2018-09-09 LAB — BILIRUBIN, DIRECT: Bilirubin,Direct: <0.2 mg/dL (ref 0.0–0.3)

## 2018-09-09 LAB — TSH: TSH: 3.27 u[IU]/mL (ref 0.27–4.20)

## 2018-09-16 ENCOUNTER — Ambulatory Visit: Payer: BLUE CROSS/BLUE SHIELD | Attending: Primary Care | Admitting: Primary Care

## 2018-09-16 ENCOUNTER — Encounter: Payer: Self-pay | Admitting: Primary Care

## 2018-09-16 VITALS — BP 102/68 | HR 74 | Ht 66.0 in | Wt 169.0 lb

## 2018-09-16 DIAGNOSIS — Z0000 Encounter for general adult medical examination without abnormal findings: Secondary | ICD-10-CM

## 2018-09-16 DIAGNOSIS — J302 Other seasonal allergic rhinitis: Secondary | ICD-10-CM

## 2018-09-16 DIAGNOSIS — R638 Other symptoms and signs concerning food and fluid intake: Secondary | ICD-10-CM

## 2018-09-16 NOTE — H&P (Signed)
History and Physical    HISTORY:  Chief Complaint   Patient presents with    Annual Exam    Lab Results         History of Present Illness:      Doing well   Desired weight loss with healthy diet and regular exercise routine.    Problems:  Patient Active Problem List   Diagnosis Code    Seasonal allergies J30.2    Abnormal Weight Gain R63.5    Allergic Rhinitis J30.9    Allergic Rhinitis Due To Dust Mite J30.89    Liver function test abnormality R94.5        Past Medical/Surgical History:   Past Medical History:   Diagnosis Date    Allergic rhinitis     Elevated LFTs     Raynaud disease      History reviewed. No pertinent surgical history.      Allergies:    Allergies   Allergen Reactions    Environmental Allergies     No Known Drug Allergy     No Known Latex Allergy        Current medications:    Current Outpatient Medications   Medication Sig    calcium carbonate-vitamin D 600-400 MG-UNIT per tablet Take 1 tablet by mouth    Evening Primrose Oil 500 MG CAPS Take 500 mg by mouth daily    TURMERIC CURCUMIN PO Take by mouth    magnesium oxide (MAG-OX) 400 (241.3 MG) MG tablet Take 400 mg by mouth daily    fluticasone (FLONASE) 50 MCG/ACT nasal spray 1 spray by Nasal route daily       Family History:    Family History   Problem Relation Age of Onset    Heart Disease Mother     Hypertension Mother     Anesthesia problems Mother     Anxiety disorder Mother     Depression Mother     Cancer Father     Colon polyps Father     No Known Problems Brother     Colon cancer Paternal Grandmother         7070's    No Known Problems Daughter     No Known Problems Daughter     Developmental Delay Sister     Diverticulitis Sister     Malignant Hypertension Neg Hx     Hypotension Neg Hx     Malignant Hyperthermia Neg Hx     Pseudochol deficiency Neg Hx     Celiac disease Neg Hx     Cirrhosis Neg Hx     Crohn's disease Neg Hx     Cystic fibrosis Neg Hx     Esophageal cancer Neg Hx      Hemochromatosis Neg Hx     Inflammatory bowel disease Neg Hx     Irritable bowel syndrome Neg Hx     Liver cancer Neg Hx     Liver Disease Neg Hx     Pancreatic Cancer Neg Hx     Rectal cancer Neg Hx     Stomach cancer Neg Hx     Ulcerative colitis Neg Hx     Wilson's disease Neg Hx        Social/Occupational History:   Social History     Socioeconomic History    Marital status: Married     Spouse name: Ted    Number of children: 2    Years of education: Not on file    Highest  education level: Not on file   Tobacco Use    Smoking status: Never Smoker    Smokeless tobacco: Never Used   Substance and Sexual Activity    Alcohol use: Yes     Alcohol/week: 2.0 standard drinks     Types: 2 Glasses of wine per week     Frequency: Monthly or less     Drinks per session: 3 or 4    Drug use: No    Sexual activity: Yes     Partners: Male     Birth control/protection: I.U.D.   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             Review of Systems:    Review of Systems   Constitutional: Negative.    HENT: Negative.    Eyes: Negative.    Cardiovascular: Negative.    Gastrointestinal: Negative.    Genitourinary: Negative.    Musculoskeletal: Negative.    Skin: Negative.    Neurological: Negative.    Endo/Heme/Allergies: Negative.    Psychiatric/Behavioral: Negative.        Vital Signs:   BP 102/68    Pulse 74    Ht 1.676 m (5\' 6" )    Wt 76.7 kg (169 lb)    BMI 27.28 kg/m       PHYSICAL EXAM:  Physical Exam   Constitutional: She is oriented to person, place, and time. She appears well-developed and well-nourished. No distress.   HENT:   Head: Normocephalic and atraumatic.   Right Ear: External ear normal.   Left Ear: External ear normal.   Nose: Nose normal.   Mouth/Throat: Oropharynx is clear and moist. No oropharyngeal exudate.   Eyes:  Pupils are equal, round, and reactive to light. Conjunctivae and EOM are normal. Right eye exhibits no discharge. Left eye exhibits no discharge. No scleral icterus.   Neck: Normal range of motion. Neck supple. No JVD present. No tracheal deviation present. No thyromegaly present.   Cardiovascular: Normal rate, regular rhythm, normal heart sounds and intact distal pulses. Exam reveals no gallop and no friction rub.   No murmur heard.  Pulmonary/Chest: Effort normal and breath sounds normal. No stridor. No respiratory distress. She has no wheezes. She has no rales. She exhibits no tenderness.   Abdominal: Soft. Bowel sounds are normal. She exhibits no distension and no mass. There is no abdominal tenderness. There is no rebound and no guarding.   Genitourinary:    Genitourinary Comments: Breasts without masses dimpling or discharge.    GYN does pelvic exam     Musculoskeletal: Normal range of motion.         General: No tenderness, deformity or edema.   Lymphadenopathy:     She has no cervical adenopathy.   Neurological: She is alert and oriented to person, place, and time. She has normal reflexes. She displays normal reflexes. No cranial nerve deficit. She exhibits normal muscle tone. Coordination normal.   Skin: Skin is warm and dry. No rash noted. She is not diaphoretic. No erythema. No pallor.   Psychiatric: She has a normal mood and affect. Her behavior is normal. Judgment and thought content normal.   Nursing note and vitals reviewed.      Assessment:  1. Routine general medical examination at a health care facility     2. Seasonal allergies     3. Weight disorder       Plan:      1.  Physical exam done  2.  Continue with seasonal use of medication  3.  Congratulated with her weight loss routine OB diet and exercise..  Continue to try and lose weight with the goal of getting her BMI under 25    Patient Instructions   Advise wear sunscreen and reapply frequently  Tetanus immunizations are required every 10  years or every 5 years if you have had an injury  Influenza vaccines are recommended yearly especially for those over 50  Pneumonia vaccines are recommended at age 79. Earlier for certain health conditions  Shingles vaccine is recommended at age 84  Please complete advance directives and healthcare proxy information and provide [place] with a copy  Mammograms are recommended typically yearly for women between the ages of 50-75  Colonoscopy is recommended starting at age 13 for colon cancer screening and earlier for some with family history or other risk factors.  Other options include sigmoidoscopy, DNA test cologuard or guaiac cards.  Advise limit alcohol intake to less than one to 2 drinks a day  Eye exams every 1-2 years to check for glaucoma  Recommend exercise 3-5 times a week for 30-45 minutes, especially work on balance  Advise to wear safety equipment such as seatbelts or helmets  If sexually active recommend safe sex practices   Advise 1000  mg of calcium a day and 1000 units of vitamin D 3 between diet and supplement unless otherwise directed

## 2018-09-16 NOTE — Patient Instructions (Signed)
Advise wear sunscreen and reapply frequently  Tetanus immunizations are required every 10 years or every 5 years if you have had an injury  Influenza vaccines are recommended yearly especially for those over 50  Pneumonia vaccines are recommended at age 55. Earlier for certain health conditions  Shingles vaccine is recommended at age 50  Please complete advance directives and healthcare proxy information and provide us with a copy  Mammograms are recommended typically yearly for women between the ages of 40-75  Colonoscopy is recommended starting at age 50 for colon cancer screening and earlier for some with family history or other risk factors.  Other options include sigmoidoscopy, DNA test cologuard or guaiac cards.  Advise limit alcohol intake to less than one to 2 drinks a day  Eye exams every 1-2 years to check for glaucoma  Recommend exercise 3-5 times a week for 30-45 minutes, especially work on balance  Advise to wear safety equipment such as seatbelts or helmets  If sexually active recommend safe sex practices   Advise 1000  mg of calcium a day and 1000 units of vitamin D 3 between diet and supplement unless otherwise directed

## 2018-11-06 ENCOUNTER — Telehealth: Payer: Self-pay | Admitting: Primary Care

## 2018-11-06 NOTE — Telephone Encounter (Signed)
URI, COVID-19 Concern     Symptoms:  1. Fever? no  2. Shortness of breath? no  3. Cough? no  4. Nasal drainage/congestion? no  5. Sore throat? no  6. Any travel outside the area in the last two weeks? Yes, [place], [place] over two weeks ago   7. Any known exposures to COVID-19 yes  Spouse, at ED at [facility] three days ago and tested came back today positive for the COVID-19.  He had a head cold couple of weeks ago.     When did symptoms start? She has none yet    Is there anything else we should know? yes  Two teenage Children      If the provider feels it is appropriate would you be agreeable to a telehome visit (telephone call with the provider in which they bill your insurance)? yes    Should she be calling the pediatrition for her children?  What should she be doing?   Please advise.

## 2018-11-06 NOTE — Telephone Encounter (Signed)
Called her, she was just getting her husband home. Will call back

## 2018-11-06 NOTE — Telephone Encounter (Signed)
Renesmay returned your call, please call back - [phone]

## 2018-11-06 NOTE — Telephone Encounter (Signed)
Telephone encounter for COVID-19 Concern  Brief HPI:   Husband symptoms started 3/11, wasn't 100%, by 3/14 much worse, fevers, body aches, flu like symptoms. No appetite, body aches. Saw PCP flu test negative. Wasn't eating or drinking so wound up in ED early 3/18 after he had syncopal episodes x 2.  He's doing better, still not well, still fevers, back home now. They are keeping him separated in their home, she and children are not sick.     Relevant Comorbitidites: none.     Relevant Travel/Exposure: as above.     Relevant Employment History: PT, teaches at [employer].      Recommendations: Advised health dept will likely be in contact with her soon. She and children will need to be quarantined in home for 14 days unless directed differently from the health department. Advised her of symptoms to be on the lookout for, reasons to call. discussed importance of handwashing, social distancing even in the house, disinfecting surfaces, etc. She appreciated call and will call back with any concerns.  Send her a Well[employer] geologist with some further info.

## 2018-11-09 ENCOUNTER — Encounter: Payer: Self-pay | Admitting: Primary Care

## 2018-11-09 ENCOUNTER — Telehealth: Payer: Self-pay | Admitting: Primary Care

## 2018-11-09 MED ORDER — ALPRAZOLAM 0.25 MG PO TABS *I*
0.2500 mg | ORAL_TABLET | Freq: Three times a day (TID) | ORAL | 0 refills | Status: DC | PRN
Start: 2018-11-09 — End: 2019-09-25

## 2018-11-09 NOTE — Telephone Encounter (Signed)
PC returned to patient at 3:34 pm on 11/09/2018    Telephone call  for COVID-19 Concern    Brief HPI:     Tickle in throat  Slightly swollen glands under jaw line   No cough, SOB    Anxiety over the fact that husband needed to go back in to the hospital last night and is now on 6 liters of oxygen as he worsened over night. Some trouble with anxiety due to uncertainty of his health and potential exposures of herself and daughters. He did use separate BR and bedroom and they are quarantined.    Relevant Comorbitidites: none    Relevant Travel/Exposure: Husband has a positive COVID test and hospitalized.    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 Gargle with warm salt water.  Reviewed supportive measures (eg. acetaminophen, fluids, etc), prevention of spread, handwashing, social distancing in the home etc. Continuing quarantine at home per the health department.  Close off the rooms he used until they can be cleaned using precautions.  Advised to call for persistent fevers despite antipyretics, shortness of breath, or any other concerns.  Patient was  agreeable to this plan.

## 2018-11-10 ENCOUNTER — Other Ambulatory Visit
Admission: RE | Admit: 2018-11-10 | Discharge: 2018-11-10 | Disposition: A | Discharge status: Home / Self Care | Payer: BLUE CROSS/BLUE SHIELD | Source: Ambulatory Visit | Attending: Internal Medicine | Admitting: Internal Medicine

## 2018-11-10 DIAGNOSIS — B9729 Other coronavirus as the cause of diseases classified elsewhere: Secondary | ICD-10-CM | POA: Insufficient documentation

## 2018-11-11 NOTE — Telephone Encounter (Signed)
Also see  Result note  Reviewed quarantine isolation precautions and health department has also discussed in detail  Advised at least one week after symptoms started and 72 hours with no symptoms

## 2018-11-13 ENCOUNTER — Other Ambulatory Visit: Payer: Self-pay | Admitting: Primary Care

## 2018-11-13 ENCOUNTER — Encounter: Payer: Self-pay | Admitting: Primary Care

## 2018-11-13 MED ORDER — ALPRAZOLAM 0.25 MG PO TABS *I*
0.2500 mg | ORAL_TABLET | Freq: Three times a day (TID) | ORAL | 0 refills | Status: DC | PRN
Start: 2018-11-13 — End: 2018-11-30

## 2018-11-13 NOTE — Telephone Encounter (Addendum)
Confirmed by chart review or by phone that Diana Bell or her parent/guardian/representative was contacted with the results of their COVID-19 lab test: Yes        Spoke with Fannie Knee. She is requesting a renewal on xanax. Some continued stress and anxiety over husbands illness  Husband at [facility] ICU on a ventilator. Some ups and downs as he struggles with COVID infection  Another treatment is being started today as he has not responded to hydroxychloroquine      She feels as though her (she is also COVID)  illness is improving   Mild nasal congestion   No fever, cough or SOB  Continues quarentine    New script for xanax sent to pharmacy

## 2018-11-18 ENCOUNTER — Telehealth: Payer: Self-pay | Admitting: Primary Care

## 2018-11-18 NOTE — Telephone Encounter (Signed)
PC to patient to see how she and family doing  No answer and no ability to leave message

## 2018-11-20 ENCOUNTER — Telehealth: Payer: Self-pay | Admitting: Primary Care

## 2018-11-20 NOTE — Telephone Encounter (Signed)
Spoke with Diana Bell last evening  Husband still hospitalized at [facility] with COVID on a vent  She is feeling better and recuperated from COVID and her quarantine is done on Saturday

## 2018-11-30 ENCOUNTER — Other Ambulatory Visit: Payer: Self-pay | Admitting: Primary Care

## 2018-11-30 MED ORDER — ALPRAZOLAM 0.25 MG PO TABS *I*
0.2500 mg | ORAL_TABLET | Freq: Three times a day (TID) | ORAL | 0 refills | Status: DC | PRN
Start: 2018-11-30 — End: 2018-12-16

## 2018-11-30 NOTE — Progress Notes (Signed)
Spoke with Diana Bell again  Husband now has tracheostomy  They have lightened up on meds and he is "mouthing words"  Has been moved to a step down unit out of ICU  She is generally using xanax about twice a day  Still some anxiety about his being on vent an recovery  Renewal on xanax sent as requested.  She is otherwise feeling physically healthy and so are her daughters.

## 2018-12-16 ENCOUNTER — Encounter: Payer: Self-pay | Admitting: Primary Care

## 2018-12-16 MED ORDER — ALPRAZOLAM 0.25 MG PO TABS *I*
0.2500 mg | ORAL_TABLET | Freq: Three times a day (TID) | ORAL | 0 refills | Status: DC | PRN
Start: 2018-12-16 — End: 2019-09-25

## 2018-12-16 NOTE — Telephone Encounter (Signed)
istop done

## 2019-01-13 ENCOUNTER — Other Ambulatory Visit: Payer: Self-pay | Admitting: Gastroenterology

## 2019-03-16 ENCOUNTER — Telehealth: Payer: Self-pay | Admitting: Primary Care

## 2019-03-16 NOTE — Telephone Encounter (Signed)
Pt aware of inj for tomorrow. Given phone number for front desk to call upon arrival.

## 2019-03-17 ENCOUNTER — Ambulatory Visit: Payer: BLUE CROSS/BLUE SHIELD

## 2019-03-17 DIAGNOSIS — Z23 Encounter for immunization: Secondary | ICD-10-CM

## 2019-03-27 ENCOUNTER — Encounter: Payer: Self-pay | Admitting: Gastroenterology

## 2019-07-13 ENCOUNTER — Other Ambulatory Visit: Payer: Self-pay | Admitting: Obstetrics

## 2019-07-29 LAB — GYN CYTOLOGY

## 2019-09-08 ENCOUNTER — Telehealth: Payer: Self-pay | Admitting: Internal Medicine

## 2019-09-08 DIAGNOSIS — Z0000 Encounter for general adult medical examination without abnormal findings: Secondary | ICD-10-CM

## 2019-09-08 NOTE — Telephone Encounter (Signed)
Patient is scheduled for physical on 09/25/19  No future labs ordered  LOV w/ SW 09/16/18  Would you like to order any labs for patient to do prior to upcoming visit?

## 2019-09-08 NOTE — Telephone Encounter (Signed)
Pt. Made aware     Labs ordered.     She is aware of fasting labs.

## 2019-09-08 NOTE — Telephone Encounter (Signed)
Labs ordered.

## 2019-09-14 ENCOUNTER — Encounter: Payer: Self-pay | Admitting: Gastroenterology

## 2019-09-15 ENCOUNTER — Other Ambulatory Visit
Admission: RE | Admit: 2019-09-15 | Discharge: 2019-09-15 | Disposition: A | Discharge status: Home / Self Care | Payer: BLUE CROSS/BLUE SHIELD | Source: Ambulatory Visit | Attending: Internal Medicine | Admitting: Internal Medicine

## 2019-09-15 DIAGNOSIS — Z0000 Encounter for general adult medical examination without abnormal findings: Secondary | ICD-10-CM | POA: Insufficient documentation

## 2019-09-15 LAB — CBC AND DIFFERENTIAL
Baso # K/uL: 0 10*3/uL (ref 0.0–0.1)
Basophil %: 0.2 %
Eos # K/uL: 0 10*3/uL (ref 0.0–0.4)
Eosinophil %: 0.4 %
Hematocrit: 40 % (ref 34–45)
Hemoglobin: 11.9 g/dL (ref 11.2–15.7)
IMM Granulocytes #: 0 10*3/uL (ref 0.0–0.0)
IMM Granulocytes: 0.4 %
Lymph # K/uL: 0.7 10*3/uL — ABNORMAL LOW (ref 1.2–3.7)
Lymphocyte %: 16.3 %
MCH: 26 pg/cell (ref 26–32)
MCHC: 30 g/dL — ABNORMAL LOW (ref 32–36)
MCV: 87 fL (ref 79–95)
Mono # K/uL: 0.4 10*3/uL (ref 0.2–0.9)
Monocyte %: 8.4 %
Neut # K/uL: 3.4 10*3/uL (ref 1.6–6.1)
Nucl RBC # K/uL: 0 10*3/uL (ref 0.0–0.0)
Nucl RBC %: 0 /100 WBC (ref 0.0–0.2)
Platelets: 158 10*3/uL — ABNORMAL LOW (ref 160–370)
RBC: 4.6 MIL/uL (ref 3.9–5.2)
RDW: 12.7 % (ref 11.7–14.4)
Seg Neut %: 74.3 %
WBC: 4.6 10*3/uL (ref 4.0–10.0)

## 2019-09-15 LAB — COMPREHENSIVE METABOLIC PANEL
ALT: 23 U/L (ref 0–35)
AST: 23 U/L (ref 0–35)
Albumin: 4.3 g/dL (ref 3.5–5.2)
Alk Phos: 96 U/L (ref 35–105)
Anion Gap: 10 (ref 7–16)
Bilirubin,Total: 0.4 mg/dL (ref 0.0–1.2)
CO2: 28 mmol/L (ref 20–28)
Calcium: 9.3 mg/dL (ref 8.6–10.2)
Chloride: 102 mmol/L (ref 96–108)
Creatinine: 1.01 mg/dL — ABNORMAL HIGH (ref 0.51–0.95)
GFR,Black: 72 *
GFR,Caucasian: 63 *
Glucose: 89 mg/dL (ref 60–99)
Lab: 15 mg/dL (ref 6–20)
Potassium: 4.7 mmol/L (ref 3.3–5.1)
Sodium: 140 mmol/L (ref 133–145)
Total Protein: 6.5 g/dL (ref 6.3–7.7)

## 2019-09-15 LAB — LIPID PANEL
Chol/HDL Ratio: 2.8
Cholesterol: 176 mg/dL
HDL: 63 mg/dL — ABNORMAL HIGH (ref 40–60)
LDL Calculated: 98 mg/dL
Non HDL Cholesterol: 113 mg/dL
Triglycerides: 75 mg/dL

## 2019-09-15 LAB — TSH: TSH: 2.24 u[IU]/mL (ref 0.27–4.20)

## 2019-09-15 LAB — HEMOGLOBIN A1C: Hemoglobin A1C: 5.7 % — ABNORMAL HIGH

## 2019-09-21 ENCOUNTER — Encounter: Payer: BLUE CROSS/BLUE SHIELD | Admitting: Primary Care

## 2019-09-22 ENCOUNTER — Encounter: Payer: BLUE CROSS/BLUE SHIELD | Admitting: Internal Medicine

## 2019-09-25 ENCOUNTER — Ambulatory Visit: Payer: BLUE CROSS/BLUE SHIELD | Admitting: Internal Medicine

## 2019-09-25 ENCOUNTER — Encounter: Payer: Self-pay | Admitting: Gastroenterology

## 2019-09-25 VITALS — BP 110/70 | HR 84 | Temp 97.8°F | Ht 66.0 in | Wt 176.0 lb

## 2019-09-25 DIAGNOSIS — R7309 Other abnormal glucose: Secondary | ICD-10-CM

## 2019-09-25 DIAGNOSIS — Z0000 Encounter for general adult medical examination without abnormal findings: Secondary | ICD-10-CM

## 2019-09-25 NOTE — H&P (Signed)
History and Physical    HISTORY:  Chief Complaint   Patient presents with    Annual Exam         History of Present Illness:    HPI     Here for physical today. It's been a trying year. Husband had COVID - diagnosed 11/01/18. He was hospitalized for 2+ months, trach, etc. Barely survived. Doing wonderfully. Back to running. Doesn't require any supplemental oxygen. He is working full time. She had a mild case as well.    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 Planning to leave at the end of the semester.   Daughters Brooke Bonito and Sr in [place] - obviously pandemic has been challenging, they are missing out on a lot, its very bittersweet.     She hasn't been exercising as much as usual what with all the stress this year  She has been working with a therapist and that has been helpful  When husband was hospitalized, she did get an rx for alprazolam but hasn't needed that since and doesn't feel as though she needs anything at this point. Sleeping well.     Up to date on GYN care, dental care, eye care. Does self breast exams. Received COVID vaccines - last one was 11 days ago, still with some anterior cervical adenopathy from that.   Sees derm regularly, actually just had a few spots frozen earlier this week.     Care Team            Penni Bombard, MD   [phone] PCP - General, [place], [place], [place] PCP - [place], Internal Medicine    Genevive Bi, MD   [phone] Ophthalmology    Judith Blonder, [facility]    Dierdre Highman, MD   [phone] Dermatology    Charissa Bash, MD   [phone] Obstetrics        Recent Results (from the past 336 hour(s))   TSH    Collection Time: 09/15/19  8:05 AM   Result Value Ref Range    TSH 2.24 0.27 - 4.20 uIU/mL   Lipid Panel (Reflex to Direct  LDL if Triglycerides more than 400)    Collection Time: 09/15/19  8:05 AM   Result Value Ref Range    Cholesterol 176 mg/dL     Triglycerides 75 mg/dL    HDL 63 (H) 40 - 60 mg/dL    LDL Calculated 98 mg/dL    Non HDL Cholesterol 113 mg/dL    Chol/HDL Ratio 2.8    Hemoglobin A1c    Collection Time: 09/15/19  8:05 AM   Result Value Ref Range    Hemoglobin A1C 5.7 (H) %   Comprehensive metabolic panel    Collection Time: 09/15/19  8:05 AM   Result Value Ref Range    Sodium 140 133 - 145 mmol/L    Potassium 4.7 3.3 - 5.1 mmol/L    Chloride 102 96 - 108 mmol/L    CO2 28 20 - 28 mmol/L    Anion Gap 10 7 - 16    UN 15 6 - 20 mg/dL    Creatinine 1.01 (H) 0.51 - 0.95 mg/dL    GFR,Caucasian 63 *    GFR,Black 72 *    Glucose 89 60 - 99 mg/dL    Calcium 9.3 8.6 - 10.2 mg/dL    Total Protein 6.5 6.3 - 7.7 g/dL  Albumin 4.3 3.5 - 5.2 g/dL    Bilirubin,Total 0.4 0.0 - 1.2 mg/dL    AST 23 0 - 35 U/L    ALT 23 0 - 35 U/L    Alk Phos 96 35 - 105 U/L   CBC and differential    Collection Time: 09/15/19  8:05 AM   Result Value Ref Range    WBC 4.6 4.0 - 10.0 THOU/uL    RBC 4.6 3.9 - 5.2 MIL/uL    Hemoglobin 11.9 11.2 - 15.7 g/dL    Hematocrit 40 34 - 45 %    MCV 87 79 - 95 fL    MCH 26 26 - 32 pg/cell    MCHC 30 (L) 32 - 36 g/dL    RDW 12.7 11.7 - 14.4 %    Platelets 158 (L) 160 - 370 THOU/uL    Seg Neut % 74.3 %    Lymphocyte % 16.3 %    Monocyte % 8.4 %    Eosinophil % 0.4 %    Basophil % 0.2 %    Neut # K/uL 3.4 1.6 - 6.1 THOU/uL    Lymph # K/uL 0.7 (L) 1.2 - 3.7 THOU/uL    Mono # K/uL 0.4 0.2 - 0.9 THOU/uL    Eos # K/uL 0.0 0.0 - 0.4 THOU/uL    Baso # K/uL 0.0 0.0 - 0.1 THOU/uL    Nucl RBC % 0.0 0.0 - 0.2 /100 WBC    Nucl RBC # K/uL 0.0 0.0 - 0.0 THOU/uL    IMM Granulocytes # 0.0 0.0 - 0.0 THOU/uL    IMM Granulocytes 0.4 %          Problems:  Patient Active Problem List   Diagnosis Code    Seasonal allergies J30.2    Allergic Rhinitis J30.9    Elevated hemoglobin A1c R73.09        Past Medical/Surgical History:   Past Medical History:   Diagnosis Date    Allergic rhinitis     Elevated LFTs     Raynaud disease      No past surgical history on  file.      Allergies:    Allergies   Allergen Reactions    Environmental Allergies     No Known Drug Allergy     No Known Latex Allergy        Current medications:    Current Outpatient Medications   Medication Sig    calcium carbonate-vitamin D 600-400 MG-UNIT per tablet Take 1 tablet by mouth    Evening Primrose Oil 500 MG CAPS Take 500 mg by mouth daily    TURMERIC CURCUMIN PO Take by mouth    magnesium oxide (MAG-OX) 400 (241.3 MG) MG tablet Take 400 mg by mouth daily    fluticasone (FLONASE) 50 MCG/ACT nasal spray 1 spray by Nasal route daily    tretinoin (RETIN-A) 0.05 % cream Apply topically nightly       Family History:    Family History   Problem Relation Age of Onset    Heart Disease Mother     Hypertension Mother     Anesthesia problems Mother     Anxiety disorder Mother     Depression Mother     Cancer Father     Colon polyps Father     No Known Problems Brother     Colon cancer Paternal Grandmother         58's    No Known Problems Daughter  No Known Problems Daughter     Developmental Delay Sister     Diverticulitis Sister     Malignant Hypertension Neg Hx     Hypotension Neg Hx     Malignant Hyperthermia Neg Hx     Pseudochol deficiency Neg Hx     Celiac disease Neg Hx     Cirrhosis Neg Hx     Crohn's disease Neg Hx     Cystic fibrosis Neg Hx     Esophageal cancer Neg Hx     Hemochromatosis Neg Hx     Inflammatory bowel disease Neg Hx     Irritable bowel syndrome Neg Hx     Liver cancer Neg Hx     Liver Disease Neg Hx     Pancreatic Cancer Neg Hx     Rectal cancer Neg Hx     Stomach cancer Neg Hx     Ulcerative colitis Neg Hx     Wilson's disease Neg Hx        Social/Occupational History:   Social History     Socioeconomic History    Marital status: Married     Spouse name: Ted    Number of children: 2    Years of education: Not on file    Highest education level: Not on file   Tobacco Use    Smoking status: Never Smoker    Smokeless tobacco: Never  Used   Substance and Sexual Activity    Alcohol use: Yes     Alcohol/week: 2.0 standard drinks     Types: 2 Glasses of wine per week     Frequency: Monthly or less     Drinks per session: 3 or 4    Drug use: No    Sexual activity: Yes     Partners: Male     Birth control/protection: I.U.D.   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             Review of Systems:    Review of Systems   Constitutional: Negative for chills, diaphoresis, fever, malaise/fatigue and weight loss.   HENT: Negative for ear pain, hearing loss and nosebleeds.    Eyes: Negative for blurred vision, double vision, photophobia and pain.   Respiratory: Negative for cough, shortness of breath and wheezing.    Cardiovascular: Positive for leg swelling. Negative for chest pain and palpitations.        Sometimes at the end of the day and in the summer. Resolves with elevation   Gastrointestinal: Negative for abdominal pain, blood in stool, constipation, diarrhea, heartburn, nausea and vomiting.   Genitourinary: Negative for dysuria, hematuria and urgency.   Musculoskeletal: Negative for back pain, myalgias and neck pain.   Neurological: Negative for dizziness, tingling, sensory change, speech change, focal weakness, seizures, weakness and headaches.   Psychiatric/Behavioral: Negative for depression and substance abuse. The patient is not nervous/anxious and does not have insomnia.        Vital Signs:   BP 110/70 (BP Location: Right arm, Patient Position: Sitting, Cuff Size: adult)    Pulse 84    Temp 36.6 C (97.8 F) (Temporal)    Ht 1.676 m ('5\' 6"' )    Wt 79.8 kg (176 lb)    SpO2 98%  BMI 28.41 kg/m       PHYSICAL EXAM:  Physical Exam   Constitutional: She is oriented to person, place, and time. She appears well-developed and well-nourished. No distress.   HENT:   Head:  Normocephalic and atraumatic.   Right Ear: Hearing, tympanic membrane, external ear and ear canal normal.   Left Ear: Hearing, tympanic membrane, external ear and ear canal normal.   Nose: Nose normal.   Mouth/Throat: Oropharynx is clear and moist. No oropharyngeal exudate.   Eyes: Pupils are equal, round, and reactive to light. Conjunctivae and EOM are normal. Right eye exhibits no discharge. Left eye exhibits no discharge. No scleral icterus.   Neck: Normal range of motion. Neck supple. No JVD present. No tracheal deviation present. No thyromegaly present.   Does have some anterior cervical adenopathy   Cardiovascular: Normal rate, regular rhythm, normal heart sounds and intact distal pulses. Exam reveals no gallop and no friction rub.   No murmur heard.  Pulmonary/Chest: Effort normal and breath sounds normal. No stridor. No respiratory distress. She has no wheezes. She has no rales. She exhibits no tenderness.   Patient declined breast exam    Abdominal: Soft. Normal appearance and bowel sounds are normal. She exhibits no distension and no mass. There is no abdominal tenderness. There is no rebound, no guarding and no CVA tenderness. No hernia.   Genitourinary:    Genitourinary Comments: Deferred to GYN     Musculoskeletal: Normal range of motion.         General: No tenderness, deformity or edema.   Lymphadenopathy:     She has no cervical adenopathy.   Neurological: She is oriented to person, place, and time. She has normal reflexes. No cranial nerve deficit. Coordination normal.   Skin: Skin is warm and dry. No rash noted. She is not diaphoretic. No erythema.   Psychiatric: She has a normal mood and affect. Her behavior is normal. Judgment and thought content normal.           Assessment/Plan:    Richel was seen today for annual exam.    Diagnoses and all orders for this visit:    1. Routine general medical examination at a health care facility  Reviewed health maintenance and safety principles. She was  given 10 years on her last colonoscopy but has +FH of colon cancer with paternal grandmother with colon cancer and father with polyps. We reviewed that she should go every 5 years, so this will be due next year. She is due for 2nd shingrix so we'll have her come back in 3 weeks, 1 month after her COVID vaccine. Labs ordered for next year.   - CBC and differential; Future  - Comprehensive metabolic panel; Future  - Hemoglobin A1c; Future  - Lipid Panel (Reflex to Direct  LDL if Triglycerides more than 400); Future  - TSH; Future    2. Elevated hemoglobin A1c  Stable over the course of many years. We'll continue to monitor. Reviewed importance of healthy eating, regularly exercise.  - Hemoglobin A1c; Future      Follow up in 1 year, sooner should concerns arise

## 2019-10-16 ENCOUNTER — Ambulatory Visit: Payer: BLUE CROSS/BLUE SHIELD

## 2019-10-16 DIAGNOSIS — Z23 Encounter for immunization: Secondary | ICD-10-CM

## 2020-01-13 ENCOUNTER — Other Ambulatory Visit: Payer: Self-pay | Admitting: Gastroenterology

## 2020-06-05 ENCOUNTER — Encounter: Payer: Self-pay | Admitting: Gastroenterology

## 2020-08-09 ENCOUNTER — Other Ambulatory Visit: Payer: Self-pay | Admitting: Gastroenterology

## 2020-09-26 ENCOUNTER — Ambulatory Visit: Payer: BLUE CROSS/BLUE SHIELD | Admitting: Internal Medicine

## 2020-09-26 ENCOUNTER — Encounter: Payer: Self-pay | Admitting: Internal Medicine

## 2020-09-26 VITALS — BP 96/66 | HR 55 | Temp 97.6°F | Ht 66.0 in | Wt 165.4 lb

## 2020-09-26 DIAGNOSIS — Z0000 Encounter for general adult medical examination without abnormal findings: Secondary | ICD-10-CM

## 2020-09-26 DIAGNOSIS — Z8249 Family history of ischemic heart disease and other diseases of the circulatory system: Secondary | ICD-10-CM

## 2020-09-26 NOTE — H&P (Signed)
History and Physical    HISTORY:  Chief Complaint   Patient presents with    Annual Exam         History of Present Illness:    HPI     No acute concerns today.  She has left [employer] and taken on job at [employer] who is getting in PT program up and running.  Very happy with that, wonderful environment.  She is been doing mostly remote work for now, only has to be there a couple of days a week.    Oldest daughter is in college in [place], doing very well.  [employer] is a Holiday representative in high school.    Her husband has made a full recovery from Covid, they are all triple vaccinated.    Patient did not do them for 7 or 8 months last year, lost some weight and then gained some back over the holidays.  She is in the restarted, goal is to get her BMI normal in 2022.  She is been exercising regularly doing a variety of different exercises.  Feels good about this.    She has taken a break from donating platelets, last time she donated the IV infiltrated in the time before that they had a hard time getting a vein, she feels like she needs a bit of a break from it.  At one point she was donating 20 times a year.    Up-to-date on eye dental GYN care etc. she does self breast exams, mammogram, colonoscopy, etc. are all up-to-date.  She is up-to-date on all of her immunizations.    Problems:  Patient Active Problem List   Diagnosis Code    Seasonal allergies J30.2    Allergic Rhinitis J30.9    Elevated hemoglobin A1c R73.09        Past Medical/Surgical History:   Past Medical History:   Diagnosis Date    Allergic rhinitis     Elevated LFTs     Raynaud disease      No past surgical history on file.      Allergies:    Allergies   Allergen Reactions    Environmental Allergies     No Known Drug Allergy     No Known Latex Allergy        Current medications:    Current Outpatient Medications   Medication Sig    tretinoin (RETIN-A) 0.05 % cream Apply topically nightly    calcium carbonate-vitamin D 600-400  MG-UNIT per tablet Take 1 tablet by mouth    Evening Primrose Oil 500 MG CAPS Take 500 mg by mouth daily    magnesium oxide (MAG-OX) 400 (241.3 MG) MG tablet Take 400 mg by mouth daily    fluticasone (FLONASE) 50 MCG/ACT nasal spray 1 spray by Nasal route daily       Family History:    Family History   Problem Relation Age of Onset    Heart Disease Mother     Hypertension Mother     Anesthesia problems Mother     Anxiety disorder Mother     Depression Mother     Cancer Father     Colon polyps Father     No Known Problems Brother     Colon cancer Paternal Grandmother         34's    No Known Problems Daughter     No Known Problems Daughter     Developmental Delay Sister     Diverticulitis Sister  Malignant Hypertension Neg Hx     Hypotension Neg Hx     Malignant Hyperthermia Neg Hx     Pseudochol deficiency Neg Hx     Celiac disease Neg Hx     Cirrhosis Neg Hx     Crohn's disease Neg Hx     Cystic fibrosis Neg Hx     Esophageal cancer Neg Hx     Hemochromatosis Neg Hx     Inflammatory bowel disease Neg Hx     Irritable bowel syndrome Neg Hx     Liver cancer Neg Hx     Liver Disease Neg Hx     Pancreatic Cancer Neg Hx     Rectal cancer Neg Hx     Stomach cancer Neg Hx     Ulcerative colitis Neg Hx     Wilson's disease Neg Hx        Social/Occupational History:   Social History     Socioeconomic History    Marital status: Married     Spouse name: Diana [employer]    Number of children: 2    Years of education: Not on file    Highest education level: Not on file   Tobacco Use    Smoking status: Never Smoker    Smokeless tobacco: Never Used   Substance and Sexual Activity    Alcohol use: Yes     Alcohol/week: 2.0 standard drinks     Types: 2 Glasses of wine per week    Drug use: No    Sexual activity: Yes     Partners: Male     Birth control/protection: I.U.D.   Other Topics Concern    Not on file   Social History Narrative    Married monogamous 1999    Professor of Physical Therapy  [employer]     Exercises regularly 90 minutes 3 days a week rowng and runs another day 3-5 miles.             Review of Systems:    Review of Systems   Constitutional: Negative for chills, diaphoresis, fever, malaise/fatigue and weight loss.   HENT: Negative for congestion, nosebleeds and sore throat.    Eyes: Negative for blurred vision, double vision, photophobia and pain.   Respiratory: Negative for cough, shortness of breath and wheezing.    Cardiovascular: Positive for leg swelling (does get some dependent edema as the day goes on). Negative for chest pain and palpitations.   Gastrointestinal: Negative for abdominal pain, blood in stool, constipation, diarrhea, heartburn, nausea and vomiting.   Genitourinary: Negative for dysuria, hematuria and urgency.   Musculoskeletal: Negative for back pain, myalgias and neck pain.   Neurological: Negative for dizziness, tingling, focal weakness, seizures, weakness and headaches.   Psychiatric/Behavioral: Negative for depression. The patient does not have insomnia.        Vital Signs:   BP 96/66    Pulse 55    Temp 36.4 C (97.6 F) (Temporal)    Ht 1.676 m (5\' 6" )    Wt 75 kg (165 lb 6.4 oz)    SpO2 99%    BMI 26.70 kg/m       PHYSICAL EXAM:  Physical Exam  Constitutional:       General: She is not in acute distress.     Appearance: Normal appearance. She is well-developed. She is not diaphoretic.   HENT:      Head: Normocephalic and atraumatic.      Right Ear: Hearing, tympanic membrane, ear canal  and external ear normal.      Left Ear: Hearing, tympanic membrane, ear canal and external ear normal.      Nose: Nose normal.      Mouth/Throat:      Pharynx: No oropharyngeal exudate.   Eyes:      General: No scleral icterus.        Right eye: No discharge.         Left eye: No discharge.      Conjunctiva/sclera: Conjunctivae normal.      Pupils: Pupils are equal, round, and reactive to light.   Neck:      Thyroid: No thyromegaly.      Vascular: No JVD.      Trachea: No  tracheal deviation.   Cardiovascular:      Rate and Rhythm: Normal rate and regular rhythm.      Heart sounds: Normal heart sounds. No murmur heard.    No friction rub. No gallop.   Pulmonary:      Effort: Pulmonary effort is normal. No respiratory distress.      Breath sounds: Normal breath sounds. No stridor. No wheezing or rales.   Chest:      Chest wall: No tenderness.      Comments: declined  Abdominal:      General: Bowel sounds are normal. There is no distension.      Palpations: Abdomen is soft. There is no mass.      Tenderness: There is no abdominal tenderness. There is no guarding or rebound.      Hernia: No hernia is present.   Genitourinary:     Comments: Deferred to GYN  Musculoskeletal:         General: No tenderness or deformity. Normal range of motion.      Cervical back: Normal range of motion and neck supple.   Lymphadenopathy:      Cervical: No cervical adenopathy.   Skin:     General: Skin is warm and dry.      Findings: No erythema or rash.      Comments: Multiple nevi, none atypical looking. Does see derm regularly.    Neurological:      Mental Status: She is oriented to person, place, and time.      Cranial Nerves: No cranial nerve deficit.      Coordination: Coordination normal.      Deep Tendon Reflexes: Reflexes are normal and symmetric.   Psychiatric:         Behavior: Behavior normal.         Thought Content: Thought content normal.         Judgment: Judgment normal.             Assessment/Plan:    [employer] was seen today for annual exam.    Diagnoses and all orders for this visit:    1. Routine general medical examination at a health care facility  Patient has never had an EKG so did obtain one today for baseline purposes.  That showed normal sinus rhythm at a rate of 58 bpm without any ST/T changes or arrhythmias.  She does have a family history of heart disease.  She is up-to-date on all health maintenance measures, encouraged continued regular exercise and weight loss efforts.  She is  due for labs which I have asked her to go for at her convenience, the orders are in the system.  I placed orders to be done before her physical next year today.  Plan to see her back in 1 year, sooner of course should concerns arise.  - EKG 12 lead; Future

## 2020-09-29 ENCOUNTER — Other Ambulatory Visit
Admission: RE | Admit: 2020-09-29 | Discharge: 2020-09-29 | Disposition: A | Discharge status: Home / Self Care | Payer: BLUE CROSS/BLUE SHIELD | Source: Ambulatory Visit | Attending: Internal Medicine | Admitting: Internal Medicine

## 2020-09-29 ENCOUNTER — Encounter: Payer: Self-pay | Admitting: Internal Medicine

## 2020-09-29 DIAGNOSIS — Z0000 Encounter for general adult medical examination without abnormal findings: Secondary | ICD-10-CM | POA: Insufficient documentation

## 2020-09-29 DIAGNOSIS — R7309 Other abnormal glucose: Secondary | ICD-10-CM | POA: Insufficient documentation

## 2020-09-29 LAB — HEMOGLOBIN A1C: Hemoglobin A1C: 5.8 % — ABNORMAL HIGH

## 2020-09-29 LAB — COMPREHENSIVE METABOLIC PANEL
ALT: 17 U/L (ref 0–35)
AST: 24 U/L (ref 0–35)
Albumin: 4.5 g/dL (ref 3.5–5.2)
Alk Phos: 106 U/L — ABNORMAL HIGH (ref 35–105)
Anion Gap: 7 (ref 7–16)
Bilirubin,Total: 0.4 mg/dL (ref 0.0–1.2)
CO2: 30 mmol/L — ABNORMAL HIGH (ref 20–28)
Calcium: 10.1 mg/dL (ref 8.6–10.2)
Chloride: 105 mmol/L (ref 96–108)
Creatinine: 0.95 mg/dL (ref 0.51–0.95)
GFR,Black: 77 *
GFR,Caucasian: 67 *
Glucose: 89 mg/dL (ref 60–99)
Lab: 14 mg/dL (ref 6–20)
Potassium: 4.3 mmol/L (ref 3.3–5.1)
Sodium: 142 mmol/L (ref 133–145)
Total Protein: 7 g/dL (ref 6.3–7.7)

## 2020-09-29 LAB — CBC AND DIFFERENTIAL
Baso # K/uL: 0 10*3/uL (ref 0.0–0.1)
Basophil %: 0.4 %
Eos # K/uL: 0 10*3/uL (ref 0.0–0.4)
Eosinophil %: 0.8 %
Hematocrit: 42 % (ref 34–45)
Hemoglobin: 13.5 g/dL (ref 11.2–15.7)
IMM Granulocytes #: 0 10*3/uL (ref 0.0–0.0)
IMM Granulocytes: 0.2 %
Lymph # K/uL: 1.9 10*3/uL (ref 1.2–3.7)
Lymphocyte %: 36.5 %
MCH: 28 pg (ref 26–32)
MCHC: 32 g/dL (ref 32–36)
MCV: 86 fL (ref 79–95)
Mono # K/uL: 0.4 10*3/uL (ref 0.2–0.9)
Monocyte %: 8.1 %
Neut # K/uL: 2.9 10*3/uL (ref 1.6–6.1)
Nucl RBC # K/uL: 0 10*3/uL (ref 0.0–0.0)
Nucl RBC %: 0 /100 WBC (ref 0.0–0.2)
Platelets: 256 10*3/uL (ref 160–370)
RBC: 4.9 MIL/uL (ref 3.9–5.2)
RDW: 12 % (ref 11.7–14.4)
Seg Neut %: 54 %
WBC: 5.3 10*3/uL (ref 4.0–10.0)

## 2020-09-29 LAB — LIPID PANEL
Chol/HDL Ratio: 2.9
Cholesterol: 194 mg/dL
HDL: 67 mg/dL — ABNORMAL HIGH (ref 40–60)
LDL Calculated: 106 mg/dL
Non HDL Cholesterol: 127 mg/dL
Triglycerides: 103 mg/dL

## 2020-09-29 LAB — TSH: TSH: 3.06 u[IU]/mL (ref 0.27–4.20)

## 2020-09-29 NOTE — Telephone Encounter (Signed)
The 10-year ASCVD risk score Denman George DC Montez Hageman., et al., 2013) is: 1%    Values used to calculate the score:      Age: 55 years      Sex: Female      Is Non-Hispanic African American: No      Diabetic: No      Tobacco smoker: No      Systolic Blood Pressure: 96 mmHg      Is BP treated: No      HDL Cholesterol: 67 mg/dL      Total Cholesterol: 194 mg/dL

## 2021-01-18 ENCOUNTER — Other Ambulatory Visit: Payer: Self-pay | Admitting: Gastroenterology

## 2021-09-14 ENCOUNTER — Encounter: Payer: Self-pay | Admitting: Gastroenterology

## 2021-09-19 ENCOUNTER — Other Ambulatory Visit
Admission: RE | Admit: 2021-09-19 | Discharge: 2021-09-19 | Disposition: A | Discharge status: Home / Self Care | Payer: BLUE CROSS/BLUE SHIELD | Source: Ambulatory Visit | Attending: Internal Medicine | Admitting: Internal Medicine

## 2021-09-19 ENCOUNTER — Other Ambulatory Visit: Payer: Self-pay | Admitting: Gastroenterology

## 2021-09-19 DIAGNOSIS — Z0000 Encounter for general adult medical examination without abnormal findings: Secondary | ICD-10-CM | POA: Insufficient documentation

## 2021-09-19 DIAGNOSIS — R7309 Other abnormal glucose: Secondary | ICD-10-CM | POA: Insufficient documentation

## 2021-09-19 LAB — COMPREHENSIVE METABOLIC PANEL
ALT: 20 U/L (ref 0–35)
AST: 19 U/L (ref 0–35)
Albumin: 4.4 g/dL (ref 3.5–5.2)
Alk Phos: 88 U/L (ref 35–105)
Anion Gap: 9 (ref 7–16)
Bilirubin,Total: 0.5 mg/dL (ref 0.0–1.2)
CO2: 29 mmol/L — ABNORMAL HIGH (ref 20–28)
Calcium: 9.9 mg/dL (ref 8.6–10.2)
Chloride: 102 mmol/L (ref 96–108)
Creatinine: 0.94 mg/dL (ref 0.51–0.95)
Glucose: 85 mg/dL (ref 60–99)
Lab: 17 mg/dL (ref 6–20)
Potassium: 4.3 mmol/L (ref 3.3–5.1)
Sodium: 140 mmol/L (ref 133–145)
Total Protein: 6.6 g/dL (ref 6.3–7.7)
eGFR BY CREAT: 71 *

## 2021-09-19 LAB — CBC AND DIFFERENTIAL
Baso # K/uL: 0 10*3/uL (ref 0.0–0.1)
Basophil %: 0.2 %
Eos # K/uL: 0 10*3/uL (ref 0.0–0.4)
Eosinophil %: 0.8 %
Hematocrit: 42 % (ref 34–45)
Hemoglobin: 13.6 g/dL (ref 11.2–15.7)
IMM Granulocytes #: 0 10*3/uL (ref 0.0–0.0)
IMM Granulocytes: 0.2 %
Lymph # K/uL: 1.7 10*3/uL (ref 1.2–3.7)
Lymphocyte %: 34.8 %
MCH: 29 pg (ref 26–32)
MCHC: 32 g/dL (ref 32–36)
MCV: 90 fL (ref 79–95)
Mono # K/uL: 0.4 10*3/uL (ref 0.2–0.9)
Monocyte %: 7.7 %
Neut # K/uL: 2.8 10*3/uL (ref 1.6–6.1)
Nucl RBC # K/uL: 0 10*3/uL (ref 0.0–0.0)
Nucl RBC %: 0 /100 WBC (ref 0.0–0.2)
Platelets: 226 10*3/uL (ref 160–370)
RBC: 4.7 MIL/uL (ref 3.9–5.2)
RDW: 12.7 % (ref 11.7–14.4)
Seg Neut %: 56.3 %
WBC: 4.9 10*3/uL (ref 4.0–10.0)

## 2021-09-19 LAB — TSH: TSH: 2.92 u[IU]/mL (ref 0.27–4.20)

## 2021-09-19 LAB — LIPID PANEL
Chol/HDL Ratio: 2.4
Cholesterol: 175 mg/dL
HDL: 74 mg/dL — ABNORMAL HIGH (ref 40–60)
LDL Calculated: 88 mg/dL
Non HDL Cholesterol: 101 mg/dL
Triglycerides: 67 mg/dL

## 2021-09-19 LAB — HEMOGLOBIN A1C: Hemoglobin A1C: 5.7 % — ABNORMAL HIGH

## 2021-09-25 ENCOUNTER — Other Ambulatory Visit: Payer: Self-pay

## 2021-09-25 ENCOUNTER — Ambulatory Visit: Payer: BLUE CROSS/BLUE SHIELD | Admitting: Internal Medicine

## 2021-09-25 VITALS — BP 90/60 | HR 60 | Temp 98.7°F | Ht 66.0 in | Wt 133.0 lb

## 2021-09-25 DIAGNOSIS — R7309 Other abnormal glucose: Secondary | ICD-10-CM

## 2021-09-25 DIAGNOSIS — F338 Other recurrent depressive disorders: Secondary | ICD-10-CM

## 2021-09-25 DIAGNOSIS — Z23 Encounter for immunization: Secondary | ICD-10-CM

## 2021-09-25 DIAGNOSIS — Z0000 Encounter for general adult medical examination without abnormal findings: Secondary | ICD-10-CM

## 2021-09-25 LAB — PCMH DEPRESSION ASSESSMENT

## 2021-09-25 NOTE — H&P (Signed)
[facility]  History and Physical Examination    HPI:    Jaelle Campanile is a 56 y.o. female here for a complete physical exam      Interval History  Doing [place] - on maintenance since November. Doing very well. Has become a coach, paying it forward. Changed her relationship with food. Exercising regularly, eating very healthfully.   Daughters both in college. Mother 36.  Some stressors related to both of those things.   Working for [employer]. First class starts this fall.   Up to date on eye care, GYN care, just had breast mammogram last week, etc.   Did have some seasonal depression type symptoms. Using a UV light and vitamin D and that has helped.     Follow-up issues: Elevated A1c has not budged despite weight loss.     Acute issues/concerns: none    CURRENT MEDICAL PROBLEMS  Patient Active Problem List   Diagnosis Code    Seasonal allergies J30.2    Allergic Rhinitis J30.9    Elevated hemoglobin A1c R73.09        CURRENT MEDICATIONS:  Prior to Admission medications    Medication Sig Start Date End Date Taking? Authorizing Provider   cholecalciferol, Vitamin D3, (CHOLECALCIFEROL) 50 mcg (2,000 unit) tablet  09/09/21  Yes [provider]   calcium carbonate-vitamin D 600-400 MG-UNIT per tablet Take 1 tablet by mouth   Yes [provider]   Evening Primrose Oil 500 MG CAPS Take 500 mg by mouth daily   Yes [provider]   magnesium oxide (MAG-OX) 400 (241.3 MG) MG tablet Take 400 mg by mouth daily   Yes [provider]   fluticasone (FLONASE) 50 MCG/ACT nasal spray 1 spray by Nasal route daily   Yes [provider]      Medications reviewed with patient, reconciled and no changes made today.    ALLERGIES:  Environmental allergies, No known drug allergy, and No known latex allergy   Allergies reviewed with patient and confirmed today.    SCREENING  Recent Review Flowsheet Data     PHQ Scores 09/25/2021 09/26/2020 09/25/2019 09/16/2018 09/16/2017  09/10/2016    PSQ2 Q1 - Interest/Pleasure - - N N N N    PSQ2 Q2 - Down, Depressed, Hopeless - - N N N N    PHQ Calculated Score 0 0 - - - -          HISTORY   Past Medical History:   Diagnosis Date    Allergic rhinitis     Elevated LFTs     Raynaud disease      No past surgical history on file.  Family History   Problem Relation Age of Onset    Heart Disease Mother     Hypertension Mother     Anesthesia problems Mother     Anxiety disorder Mother     Depression Mother     Cancer Father     Colon polyps Father     No Known Problems Brother     Colon cancer Paternal Grandmother         15's    No Known Problems Daughter     No Known Problems Daughter     Developmental Delay Sister     Diverticulitis Sister     Malignant Hypertension Neg Hx     Hypotension Neg Hx     Malignant Hyperthermia Neg Hx     Pseudochol deficiency Neg Hx  Celiac disease Neg Hx     Cirrhosis Neg Hx     Crohn's disease Neg Hx     Cystic fibrosis Neg Hx     Esophageal cancer Neg Hx     Hemochromatosis Neg Hx     Inflammatory bowel disease Neg Hx     Irritable bowel syndrome Neg Hx     Liver cancer Neg Hx     Liver Disease Neg Hx     Pancreatic Cancer Neg Hx     Rectal cancer Neg Hx     Stomach cancer Neg Hx     Ulcerative colitis Neg Hx     Wilson's disease Neg Hx      Social History     Social History Narrative    Married monogamous 1999    Professor of Physical Therapy [employer]     Exercises regularly 90 minutes 3 days a week rowng and runs another day 3-5 miles.              Patient is a non smoker     Metabolic Profile CBC   Lab Results   Component Value Date    NA 140 09/19/2021    K 4.3 09/19/2021    CL 102 09/19/2021    CO2 29 (H) 09/19/2021    UN 17 09/19/2021    CREAT 0.94 09/19/2021    GLU 85 09/19/2021    Lab Results   Component Value Date    WBC 4.9 09/19/2021    HGB 13.6 09/19/2021    HCT 42 09/19/2021    PLT 226 09/19/2021      LIPIDS DIABETES   Lab Results   Component Value Date    CHOL 175  09/19/2021    TRIG 67 09/19/2021    HDL 74 (H) 09/19/2021    LDLC 88 09/19/2021    LDLC 106 09/29/2020    LDLC 98 09/15/2019    CHHDC 2.4 09/19/2021    Lab Results   Component Value Date    GLU 85 09/19/2021     Hemoglobin A1C   Date Value Ref Range Status   09/19/2021 5.7 (H) % Final     Comment:     Ref Range <=5.6  HbA1c values of 5.7-6.4% indicate an increased risk for developing  diabetes mellitus.  HbA1c values greater than or equal to 6.5% are diagnostic of  diabetes mellitus.  For diagnosis of diabetes in individuals without unequivocal  hyperglycemia, results should be confirmed by repeat testing.     09/29/2020 5.8 (H) % Final     Comment:     Ref Range <=5.6  HbA1c values of 5.7-6.4% indicate an increased risk for developing  diabetes mellitus.  HbA1c values greater than or equal to 6.5% are diagnostic of  diabetes mellitus.  For diagnosis of diabetes in individuals without unequivocal  hyperglycemia, results should be confirmed by repeat testing.     09/15/2019 5.7 (H) % Final     Comment:     Ref Range <=5.6  HbA1c values of 5.7-6.4% indicate an increased risk for developing  diabetes mellitus.  HbA1c values greater than or equal to 6.5% are diagnostic of  diabetes mellitus.  For diagnosis of diabetes in individuals without unequivocal  hyperglycemia, results should be confirmed by repeat testing.     09/09/2018 5.7 (H) % Final     Comment:     Ref Range <=5.6  HbA1c values of 5.7-6.4% indicate an increased risk for developing  diabetes mellitus.  HbA1c values greater than or equal to 6.5% are diagnostic of  diabetes mellitus.  For diagnosis of diabetes in individuals without unequivocal  hyperglycemia, results should be confirmed by repeat testing.     09/13/2017 5.8 (H) % Final     Comment:     Ref Range <=5.6  HbA1c values of 5.7-6.4% indicate an increased risk for developing  diabetes mellitus.  HbA1c values greater than or equal to 6.5% are diagnostic of  diabetes mellitus.  For diagnosis of  diabetes in individuals without unequivocal  hyperglycemia, results should be confirmed by repeat testing.          LIVER MISC   Lab Results   Component Value Date    AST 19 09/19/2021    ALT 20 09/19/2021    Lab Results   Component Value Date    TSH 2.92 09/19/2021    VID25 31 07/25/2015        Immunization History   Administered Date(s) Administered    COVID-19 MRNA Bivalent Vaccine Booster Moderna 50 mcg/0.32m IM SUSP 05/03/2021    Covid-19 mRNA vaccine (PFIZER) IM 30 mcg/0.33m01/11/2019, 09/14/2019, 06/05/2020    Hepatitis A Adult 10/23/2011    Influenza Adult(3y65yrd up) 08/07/2012, 05/10/2015    Influenza Cell-Based prefilled syringe (Flucelvax) 7mo53mo up 05/03/2021    Influenza PF(3 year and up) 05/07/2017, 05/06/2018    Influenza Quadrivalent 0.5mL 96mfilled syringe/single dose vial(FluLaval,Fluzone,Afluria,Fluarix) 05/26/2019, 05/29/2020    Influenza multi-dose vial 05/07/2013, 05/26/2019    MMR 03/27/2006    PPD Test 04/09/2012    Td 03/20/2006    Varicella 08/20/1994, 03/29/2006    Zoster(Shingrix) 03/17/2019, 10/16/2019        Health Maintenance Topics with due status: Overdue       Topic Date Due    HIV Screening USPSTF/[place] Never done    IMM DTaP/Tdap/Td 03/21/2006     Health Maintenance Topics with due status: Not Due       Topic Last Completion Date    Colon Cancer Screening Other 03/26/2016    Cervical Cancer Screening USPSTF 07/13/2019    Breast Cancer Screening Other 01/18/2021    DEPRESSION SCREEN YEARLY 09/25/2021     Health Maintenance Topics with due status: Completed       Topic Last Completion Date    Hepatitis C Screening USPSTF/[place] 07/25/2010    IMM-ZOSTER 10/16/2019    IMM-INFLUENZA 05/03/2021    COVID-19 Vaccine 05/03/2021     Health Maintenance Topics with due status: Aged Out       Topic Date Due    IMM Pneumo:Peds(0-75yrs)60yrAt-Risk Patients(6-33yrs)42yr Out       Review of Systems:  CONSTITUTIONAL: No fatigue, weakness; no fevers, chills. No unintended weight loss or  unexpected weight gain.   HEENT: No visual changes, no hearing loss. Was getting some blood tinged mucus from the right naris, increased hydration and that resolved.    CARDIOPULMONARY: No chest pain, palpitations, syncope, peripheral edema. No cough, hemoptysis, dyspnea  GASTROINTESTINAL: No loss of appetite, heartburn, nausea, vomiting, abdominal pain. No diarrhea, constipation, melena or hematochezia, no change in bowel habits  GU: No concerning urgency, hesitancy, frequency, nocturia, incontinence; no hernias.   MUSCULOSKELETAL: No painful or swollen joints, weakness, back pain.  NEURO: No headaches, dizziness, vertigo, localized numbness, tingling, weakness. Does have Raynauds - right index finger most affected. If she can warm it up when she first notes it she does okay.   PSYCH : No anxiety or depression. No difficulties  with sleep or memory. No substance abuse.   DERM: Sees derm - lesion on her leg she is going to have removed.     EXAMINATION:  Vitals:    09/25/21 1023   BP: 90/60   BP Location: Left arm   Patient Position: Sitting   Cuff Size: adult   Pulse: 60   Temp: 37.1 C (98.7 F)   TempSrc: Temporal   SpO2: 98%   Weight: 60.3 kg (133 lb)   Height: 1.676 m ('5\' 6"' )                                 Body mass index is 21.47 kg/m.  BP Readings from Last 4 Encounters:   09/25/21 90/60   09/26/20 96/66   09/25/19 110/70   09/16/18 102/68      Wt Readings from Last 4 Encounters:   09/25/21 60.3 kg (133 lb)   09/26/20 75 kg (165 lb 6.4 oz)   09/25/19 79.8 kg (176 lb)   09/16/18 76.7 kg (169 lb)         General Appearance:    Alert, cooperative, well nourished, non toxic, no acute distress.   Head:    Normocephalic, Atraumatic   Eyes:    Conjunctiva clear and without pallor, sclerae anicteric. PERRLA, EOMI.   Ears:   Bilateral TMs are clear without erythema or effusion.  Canals without erythema or edema. There is scant cerumen on the right near the outer portion of the canal, not obstructive, within normal  limits.    Oropharynx:   Buccal mucosa normal; good dentition; oropharynx clear   Neck:   Supple, no thyromegaly or tenderness.   Lungs:     Respiratory rate is normal. No respiratory distress noted. Clear to auscultation bilaterally without unlabored respiratory effort    Heart:    Regular rate and rhythm, S1 and S2 normal, no murmur, rub or gallop   Abdomen:     Soft, non-tender, no masses, no hepatosplenomegaly or hernias.    Extremities:   Warm and well perfused, legs without edema.    Musculoskeletal:   Normal movement and range of motion in all for extremities.    Skin:   Warm, dry, no rashes or lesions noted in visible areas   Lymph nodes:   No cervical or supraclavicular lymphadenopathy   Neurologic:   Alert and oriented to person, place and time. Patellar reflexes 2+ and symmetric. Normal, steady gait.    Psychiatric:   Good eye contact. Appropriate mood and affect     ASSESSMENT/PLAN:  Isolde Skaff was seen today for a physical examination. Findings as detailed below:     1. Routine general medical examination at a health care facility  Encouraged continued healthy lifestyle. She is up to date on all health maintenance. We'll recheck labs as below before her physical next year.   - CBC and differential; Future  - Comprehensive metabolic panel; Future  - Hemoglobin A1c; Future  - Lipid Panel (Reflex to Direct  LDL if Triglycerides more than 400); Future  - TSH; Future  - Vitamin D; Future    2. Elevated hemoglobin A1c  Discussed today. Continue to monitor. Think this is unlikely to progress particularly in light of her weight loss.     3. Immunization due  Tetanus updated today.   - Tdap (>/= 14yr (Boostrix)     4. Seasonal affective disorder  Doing well with  vitamin d and UV light. We'll check vitamin D with labs next year.   - Vitamin D; Future      Follow up with me in 1 year for a physical, sooner should concerns arise.     Collier Bullock, PA 09/25/2021 10:54 AM

## 2021-10-24 ENCOUNTER — Encounter: Payer: Self-pay | Admitting: Gastroenterology

## 2022-01-23 ENCOUNTER — Other Ambulatory Visit: Payer: Self-pay | Admitting: Gastroenterology

## 2022-03-29 ENCOUNTER — Ambulatory Visit: Payer: BLUE CROSS/BLUE SHIELD | Admitting: Internal Medicine

## 2022-03-29 ENCOUNTER — Encounter: Payer: Self-pay | Admitting: Internal Medicine

## 2022-03-29 VITALS — BP 103/57 | HR 81 | Temp 97.7°F | Wt 130.0 lb

## 2022-03-29 DIAGNOSIS — U071 COVID-19: Secondary | ICD-10-CM

## 2022-03-29 MED ORDER — NIRMATRELVIR & RITONAVIR 150-100 MG PO TABS *I*
ORAL_TABLET | ORAL | 0 refills | Status: AC
Start: 2022-03-29 — End: 2022-04-03

## 2022-03-29 NOTE — Progress Notes (Signed)
Video Visit   Mode of Communication with Patient for This Visit    Mode of Communication with Patient for This Visit: Video  Patient Location: Home  Provider Location: Clinic              Other participants in telemedicine encounter and roles:  none    This is an established patient visit.    Reason for visit: PCP/CCP Notified of COVID-19 Result (Pt. Tested positive this am.  Onset of sx: 03/26/22)      HPI  Patient presents today via video due to positive home COVID test.  Her symptoms began on 8/7 with a scratchy throat and a little bit of cough.  That continued through 8/8, she tested negative for COVID that day and felt okay but then the coughing got worse, she felt pretty bad yesterday and this morning she tested positive.  She has been sleeping okay but tired from all the coughing, hot and cold flashes, shivering at times.  Has not documented a fever but has been taking Advil pretty consistently.  Also used a cold and sinus product last night. Shee denies any chest pain or shortness of breath.  She does endorse fatigue.  She is fully vaccinated against COVID and did have COVID in March 2020.    ROS  As above    Patient's problem list, allergies, and medications were reviewed and updated as appropriate.  Please see the EHR for full details.    Exam and data reviewed:  Video visit, vital signs unavailable with the exception of any home readings as noted in HPI  General: Patient sitting in bed, appeared mildly ill but not toxic, well nourished. No acute distress noted.   Skin: Clean and dry no rashes or lesions noted.   Eyes: Conjunctiva are clear, no discharge or lid edema noted.   ENT: No nasal discharge noted. Voice was minimally raspy.   Respiratory: Breathing was unlabored, intermittent cough noted. no audible wheezes/rales or rhonchi.     Neuro: Speech was clear, normal mentation, cranial nerves II-XII grossly intact.   Psych: normal affect. Answers questions appropriately.       Assessment & Plan:  1.  COVID-19  Diagnosis isolation and quarantine guidelines reviewed with patient.  Going to treated with Paxlovid, prescription issued and use and adverse effects reviewed.  I have asked her to hold her primrose oil while she is on this.  Did review the risk of rebound with this.  She will call back with any new or worsening symptoms particularly should she develop any chest pain shortness of breath or other concerns.      Consent was obtained from the patient to complete this video visit; including the potential for financial liability.      Darliss Ridgel, PA

## 2022-03-29 NOTE — Patient Instructions (Signed)
Paxlovid has been prescribed  You will need to hold (not take) the following meds while on this: primrose oil    Paxlovid was originally recommended by CDC for unvaccinated people at high risk of severe COVID. The purpose of this medication is to decrease the risk of hospitalization.  Being vaccinated already decreases the risk of this significantly.  Paxlovid does not significantly decrease the duration of symptoms and it does not shorten the isolation/quarantine period. There are several drug interactions with paxlovid. You will need to hold any herbal supplements, should not take any over-the-counter medications other than Tylenol, ibuprofen, or Mucinex without checking with [place] first. The big thing to be aware of with paxlovid is that there is about a 10% risk of rebound.  This means that within a week or so after completing Paxlovid, all of the COVID symptoms come back and the isolation period starts all over again when this happens.     Recommend isolation for 5 days with day zero being the day symptoms start. Isolation ends after the end of day 5    After 5 days, if symptoms have resolved, isolation ends. Should wear a well-fitting mask when you are around people for a further 5 days.  If you are still symptomatic, isolate another 5 days.  See below regarding more isolation guidelines     Self-Isolation Information:  o Shelter in place maintaining separate living space from others in the home as best as you can. Prefer a door separate from the rest of the living area.   o If possible use separate bathroom and bedroom  o If not possible to separate ensure proper disinfection of shared spaces and distance as able within the home     If developing shortness of breath or chest pain, needs evaluation.

## 2022-05-28 ENCOUNTER — Encounter: Payer: Self-pay | Admitting: Gastroenterology

## 2022-08-23 ENCOUNTER — Encounter: Payer: Self-pay | Admitting: Gastroenterology

## 2022-09-21 ENCOUNTER — Other Ambulatory Visit
Admission: RE | Admit: 2022-09-21 | Discharge: 2022-09-21 | Disposition: A | Discharge status: Home / Self Care | Payer: BLUE CROSS/BLUE SHIELD | Source: Ambulatory Visit | Attending: Internal Medicine | Admitting: Internal Medicine

## 2022-09-21 DIAGNOSIS — F338 Other recurrent depressive disorders: Secondary | ICD-10-CM | POA: Insufficient documentation

## 2022-09-21 DIAGNOSIS — Z0000 Encounter for general adult medical examination without abnormal findings: Secondary | ICD-10-CM

## 2022-09-21 DIAGNOSIS — R7309 Other abnormal glucose: Secondary | ICD-10-CM | POA: Insufficient documentation

## 2022-09-21 LAB — COMPREHENSIVE METABOLIC PANEL
ALT: 17 U/L (ref 0–35)
AST: 23 U/L (ref 0–35)
Albumin: 4.3 g/dL (ref 3.5–5.2)
Alk Phos: 93 U/L (ref 35–105)
Anion Gap: 9 (ref 7–16)
Bilirubin,Total: 0.6 mg/dL (ref 0.0–1.2)
CO2: 28 mmol/L (ref 20–28)
Calcium: 10 mg/dL (ref 8.6–10.2)
Chloride: 104 mmol/L (ref 96–108)
Creatinine: 1.12 mg/dL — ABNORMAL HIGH (ref 0.51–0.95)
Glucose: 79 mg/dL (ref 60–99)
Lab: 17 mg/dL (ref 6–20)
Potassium: 5.1 mmol/L (ref 3.3–5.1)
Sodium: 141 mmol/L (ref 133–145)
Total Protein: 6.7 g/dL (ref 6.3–7.7)
eGFR BY CREAT: 57 * — AB

## 2022-09-21 LAB — CBC AND DIFFERENTIAL
Baso # K/uL: 0 10*3/uL (ref 0.0–0.2)
Basophil %: 0.3 %
Eos # K/uL: 0 10*3/uL (ref 0.0–0.5)
Eosinophil %: 0.5 %
Hematocrit: 42 % (ref 34–49)
Hemoglobin: 13.5 g/dL (ref 11.2–16.0)
IMM Granulocytes #: 0 10*3/uL (ref 0.0–0.0)
IMM Granulocytes: 0.3 %
Lymph # K/uL: 1.5 10*3/uL (ref 1.0–5.0)
Lymphocyte %: 38.4 %
MCH: 28 pg (ref 26–32)
MCHC: 32 g/dL (ref 32–36)
MCV: 88 fL (ref 75–100)
Mono # K/uL: 0.3 10*3/uL (ref 0.1–1.0)
Monocyte %: 8.1 %
Neut # K/uL: 2 10*3/uL (ref 1.5–6.5)
Nucl RBC # K/uL: 0 10*3/uL (ref 0.0–0.0)
Nucl RBC %: 0 /100 WBC (ref 0.0–0.2)
Platelets: 220 10*3/uL (ref 150–450)
RBC: 4.8 MIL/uL (ref 4.0–5.5)
RDW: 12.9 % (ref 0.0–15.0)
Seg Neut %: 52.4 %
WBC: 3.9 10*3/uL (ref 3.5–11.0)

## 2022-09-21 LAB — LIPID PANEL
Chol/HDL Ratio: 2.2
Cholesterol: 177 mg/dL
HDL: 79 mg/dL — ABNORMAL HIGH (ref 40–60)
LDL Calculated: 84 mg/dL
Non HDL Cholesterol: 98 mg/dL
Triglycerides: 68 mg/dL

## 2022-09-21 LAB — TSH: TSH: 2.4 u[IU]/mL (ref 0.27–4.20)

## 2022-09-21 LAB — HEMOGLOBIN A1C: Hemoglobin A1C: 5.5 %

## 2022-09-21 LAB — VITAMIN D: 25-OH Vit Total: 47 ng/mL (ref 30–60)

## 2022-09-22 ENCOUNTER — Other Ambulatory Visit: Payer: Self-pay | Admitting: Gastroenterology

## 2022-09-26 ENCOUNTER — Encounter: Payer: Self-pay | Admitting: Gastroenterology

## 2022-09-27 ENCOUNTER — Encounter: Payer: Self-pay | Admitting: Internal Medicine

## 2022-09-27 ENCOUNTER — Other Ambulatory Visit: Payer: Self-pay

## 2022-09-27 ENCOUNTER — Ambulatory Visit: Payer: BLUE CROSS/BLUE SHIELD | Admitting: Internal Medicine

## 2022-09-27 VITALS — BP 100/62 | HR 65 | Temp 97.5°F | Ht 65.5 in | Wt 137.0 lb

## 2022-09-27 DIAGNOSIS — H9313 Tinnitus, bilateral: Secondary | ICD-10-CM

## 2022-09-27 DIAGNOSIS — Z0000 Encounter for general adult medical examination without abnormal findings: Secondary | ICD-10-CM

## 2022-09-27 NOTE — Progress Notes (Signed)
[facility]  NP Student History and Physical Examination    HPI:    Diana Bell is a 57 y.o. female here for a complete physical exam. She is overall doing well.     Interval History    Diana Bell has been continuing her healthy diet and exercising daily. She is maintaining her overall weight loss of 34 lbs and eats small, frequent meals. She runs several times a week along with weight lifting and meditation. She is UTD on all of her annual cancer screenings. Sees OBGYN and Dermatology annually. Alternates yearly breast screenings with mammograms and MRIs, she is due June of 2024 which she is aware of.     She reports some increased stress in her life related to having to transition her mother into assisted living. Overall, she still has been able to maintain healthy and positive relationships with family members and friends. 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 She spends 3 days out of the week in [place] where she teaches.     Follow-up issues:     Elevated HA1C:  - A1C is down from 5.7 to 5.5 on labs     Declines HIV screening today.     Acute issues/concerns:     Tinnitus:  - started in December  - has not gone away  - denies otalgia, headaches, dizziness  - does not disrupt sleep   - denies congestion, cough, sinus pressure, or cold symptoms   - denies hearing loss    Elevated creatinine:  - elevated creatinine of labs  - has never had an issue in the past  - no new medications or diet changes  - endorses the night before she had her labs drawn had a few alcoholic beverages and felt very unwell and dehydrated the morning she had her lab work drawn     CURRENT MEDICAL PROBLEMS  Patient Active Problem List   Diagnosis Code    Seasonal allergies J30.2    Allergic Rhinitis J30.9    Elevated hemoglobin A1c R73.09        CURRENT MEDICATIONS:    Current Outpatient Medications:     estradiol (ESTRACE) 0.1 MG/GM vaginal cream, Place 1 g vaginally three times a week, Disp: , Rfl:      cholecalciferol, Vitamin D3, (CHOLECALCIFEROL) 50 mcg (2,000 unit) tablet, , Disp: , Rfl:     calcium carbonate-vitamin D 600-400 MG-UNIT per tablet, Take 1 tablet by mouth, Disp: , Rfl:     Evening Primrose Oil 500 MG CAPS, Take 500 mg by mouth daily, Disp: , Rfl:     magnesium oxide (MAG-OX) 400 (241.3 MG) MG tablet, Take 1 tablet (400 mg total) by mouth daily, Disp: , Rfl:     fluticasone (FLONASE) 50 MCG/ACT nasal spray, Spray 1 spray into nostril daily, Disp: , Rfl:   Medications reviewed with patient, reconciled and no changes made today.    ALLERGIES:  Environmental allergies, No known drug allergy, and No known latex allergy   Allergies reviewed with patient and confirmed today.    SCREENING  Promis Cat V1.0 - Depression    09/25/2022  7:03 PM EST - Filed by Patient   I felt depressed Never   I felt unhappy Sometimes   I felt like a failure Never   I felt sad Never   I felt discouraged about the future Never   I felt disappointed in myself Never   I felt emotionally exhausted Sometimes  PROMIS Depression T-Score (range: 10 - 90) 46 (within normal limits)         HISTORY   Past Medical History:   Diagnosis Date    Allergic rhinitis     Elevated LFTs     Raynaud disease      No past surgical history on file.  Family History   Problem Relation Age of Onset    Heart Disease Mother     Hypertension Mother     Anesthesia problems Mother     Anxiety disorder Mother     Depression Mother     Cancer Father     Colon polyps Father     No Known Problems Brother     Colon cancer Paternal Grandmother         32's    No Known Problems Daughter     No Known Problems Daughter     Developmental Delay Sister     Diverticulitis Sister     Malignant Hypertension Neg Hx     Hypotension Neg Hx     Malignant Hyperthermia Neg Hx     Pseudochol deficiency Neg Hx     Celiac disease Neg Hx     Cirrhosis Neg Hx     Crohn's disease Neg Hx     Cystic fibrosis Neg Hx     Esophageal cancer Neg Hx     Hemochromatosis Neg Hx     Inflammatory  bowel disease Neg Hx     Irritable bowel syndrome Neg Hx     Liver cancer Neg Hx     Liver Disease Neg Hx     Pancreatic Cancer Neg Hx     Rectal cancer Neg Hx     Stomach cancer Neg Hx     Ulcerative colitis Neg Hx     Wilson's disease Neg Hx      Social History     Social History Narrative    Married monogamous 1999    Professor of Physical Therapy [employer]     Exercises regularly 90 minutes 3 days a week rowng and runs another day 3-5 miles.              Patient is a non smoker     Metabolic Profile CBC   Lab Results   Component Value Date    NA 141 09/21/2022    K 5.1 09/21/2022    CL 104 09/21/2022    CO2 28 09/21/2022    UN 17 09/21/2022    CREAT 1.12 (H) 09/21/2022    GLU 79 09/21/2022    Lab Results   Component Value Date    WBC 3.9 09/21/2022    HGB 13.5 09/21/2022    HCT 42 09/21/2022    PLT 220 09/21/2022      LIPIDS DIABETES   Lab Results   Component Value Date    CHOL 177 09/21/2022    TRIG 68 09/21/2022    HDL 79 (H) 09/21/2022    LDLC 84 09/21/2022    LDLC 88 09/19/2021    LDLC 106 09/29/2020    CHHDC 2.2 09/21/2022    Lab Results   Component Value Date    GLU 79 09/21/2022     Hemoglobin A1C   Date Value Ref Range Status   09/21/2022 5.5 % Final     Comment:     Ref Range <=5.6  HbA1c values of 5.7-6.4% indicate an increased risk for developing  diabetes mellitus.  HbA1c values greater than  or equal to 6.5% are diagnostic of  diabetes mellitus.  For diagnosis of diabetes in individuals without unequivocal  hyperglycemia, results should be confirmed by repeat testing.     09/19/2021 5.7 (H) % Final     Comment:     Ref Range <=5.6  HbA1c values of 5.7-6.4% indicate an increased risk for developing  diabetes mellitus.  HbA1c values greater than or equal to 6.5% are diagnostic of  diabetes mellitus.  For diagnosis of diabetes in individuals without unequivocal  hyperglycemia, results should be confirmed by repeat testing.     09/29/2020 5.8 (H) % Final     Comment:     Ref Range <=5.6  HbA1c values of  5.7-6.4% indicate an increased risk for developing  diabetes mellitus.  HbA1c values greater than or equal to 6.5% are diagnostic of  diabetes mellitus.  For diagnosis of diabetes in individuals without unequivocal  hyperglycemia, results should be confirmed by repeat testing.     09/15/2019 5.7 (H) % Final     Comment:     Ref Range <=5.6  HbA1c values of 5.7-6.4% indicate an increased risk for developing  diabetes mellitus.  HbA1c values greater than or equal to 6.5% are diagnostic of  diabetes mellitus.  For diagnosis of diabetes in individuals without unequivocal  hyperglycemia, results should be confirmed by repeat testing.     09/09/2018 5.7 (H) % Final     Comment:     Ref Range <=5.6  HbA1c values of 5.7-6.4% indicate an increased risk for developing  diabetes mellitus.  HbA1c values greater than or equal to 6.5% are diagnostic of  diabetes mellitus.  For diagnosis of diabetes in individuals without unequivocal  hyperglycemia, results should be confirmed by repeat testing.          LIVER MISC   Lab Results   Component Value Date    AST 23 09/21/2022    ALT 17 09/21/2022    Lab Results   Component Value Date    TSH 2.40 09/21/2022    VID25 47 09/21/2022        Immunization History   Administered Date(s) Administered    COVID-19 MRNA Bivalent Vaccine Booster Moderna 50 mcg/0.54mL IM SUSP 05/03/2021    COVID-19 mRNA 2023-24 Vaccine (Moderna Spikevax) 12 Years+ 05/24/2022    Covid-19 mRNA vaccine (PFIZER) IM 30 mcg/0.30mL 08/24/2019, 09/14/2019    Hepatitis A Adult 10/23/2011    Influenza Adult(71yr and up) 08/07/2012, 05/10/2015    Influenza PF(3 year and up) 05/07/2017, 05/06/2018    Influenza Quad 0.41mL prefilled syringe/single dose vial (FluLaval,Fluzone,Afluria,Fluarix) 05/26/2019, 05/29/2020    Influenza Quad Cell-Based prefilled syringe (Flucelvax) 35mo+ 05/03/2021    Influenza multi-dose vial 05/07/2013, 05/26/2019    MMR 03/27/2006    PPD Test 04/09/2012    Td 03/20/2006    Tdap 09/25/2021    Varicella  08/20/1994, 03/29/2006    Zoster(Shingrix) 03/17/2019, 10/16/2019        Health Maintenance Topics with due status: Overdue       Topic Date Due    HIV Screening USPSTF/[place] Never done     Health Maintenance Topics with due status: Not Due       Topic Last Completion Date    Colon Cancer Screening Other 03/26/2016    Cervical Cancer Screening USPSTF 07/13/2019    IMM DTaP/Tdap/Td 09/25/2021    Breast Cancer Screening Other 01/23/2022    Depression Screen Yearly 09/27/2022     Health Maintenance Topics with due status: Completed  Topic Last Completion Date    Hepatitis C Screening USPSTF/[place] 07/25/2010    IMM-ZOSTER 10/16/2019    IMM-INFLUENZA 05/24/2022    COVID-19 Vaccine 05/24/2022     Health Maintenance Topics with due status: Aged Out       Topic Date Due    IMM-HIB 0-5 Yrs or At-Risk Patients Aged Out    IMM-HPV 9-26 Yrs or Shared Decision (27-45 Yrs) Aged Out    IMM-MCV4 0-18 Yrs or At-Risk Patients Aged Out    IMM-ROTAVIRUS 0-8 MOS Aged Out    IMM Pneumo:Peds(0-57yrs) or At-Risk Patients(6-32yrs) Aged Out       Review of Systems:  CONSTITUTIONAL: No fatigue, weakness; no fevers, chills. No unintended weight loss or unexpected weight gain.   HEENT: No visual changes, no hearing loss. No epistaxis, snoring or hoarseness   CARDIOPULMONARY: No chest pain, palpitations, syncope, peripheral edema. No cough, hemoptysis, dyspnea  GASTROINTESTINAL: No loss of appetite, heartburn, nausea, vomiting, abdominal pain. No diarrhea, constipation, melena or hematochezia, no change in bowel habits or change in stool caliber.  GU: No concerning urgency, hesitancy, frequency, nocturia, incontinence;   MUSCULOSKELETAL: No painful or swollen joints, weakness, back pain.  NEURO: No headaches, dizziness, vertigo, localized numbness, tingling, weakness.   PSYCH : No anxiety or depression. No difficulties with sleep or memory. No substance abuse.   DERM: No new or changing skin lesions or rashes    EXAMINATION:  Vitals:    09/27/22  1002   BP: 100/62   Pulse: 65   Temp: 36.4 C (97.5 F)   TempSrc: Temporal   SpO2: 100%   Weight: 62.1 kg (137 lb)   Height: 1.664 m (5' 5.5")                                 Body mass index is 22.45 kg/m.  BP Readings from Last 4 Encounters:   09/27/22 100/62   03/29/22 103/57   09/25/21 90/60   09/26/20 96/66      Wt Readings from Last 4 Encounters:   09/27/22 62.1 kg (137 lb)   03/29/22 59 kg (130 lb)   09/25/21 60.3 kg (133 lb)   09/26/20 75 kg (165 lb 6.4 oz)         General Appearance:    Alert, cooperative, well nourished, non toxic, no acute distress.   Head:    Normocephalic, Atraumatic   Eyes:    Conjunctiva clear and without pallor, sclerae anicteric.    Ears:   Bilateral TMs are clear without erythema or effusion.  Canals without erythema or edema. There is mild, non-impacted cerumen in the right ear canal which was removed under direct visualization with lighted curette.    Oropharynx:   Buccal mucosa normal; good dentition; oropharynx clear   Neck:   Supple, no thyromegaly or tenderness.   Lungs:     Respiratory rate is normal. No respiratory distress noted. Clear to auscultation bilaterally without unlabored respiratory effort   Chest Wall:  No tenderness or masses. No pectoral or axillary lymphadenopathy.     Heart:    Regular rate and rhythm, S1 and S2 normal, no murmur, rub or gallop   Abdomen:     Soft, non-tender, no masses, no hepatosplenomegaly or hernias.    Extremities:   Warm and well perfused, legs without edema.    Musculoskeletal:   Normal movement and range of motion in all for extremities.  Skin:   Warm, dry, no rashes or lesions noted in visible areas   Lymph nodes:   No cervical or supraclavicular lymphadenopathy   Neurologic:   Alert and oriented to person, place and time. Patellar reflexes 2+ and symmetric. Normal, steady gait.    Psychiatric:   Good eye contact. Appropriate mood and affect     ASSESSMENT/PLAN:  Diana Bell was seen today for a physical examination. Findings  as detailed below:     1. Routine general medical examination at a health care facility  - Discussed with Diana Bell that her labs overall look very good. The elevated creatinine and decreased GFR was likely due to the alcohol she drank the night before and being dehydrated. We'll continue to monitor annually but offered reassurance.   - Regarding her social stressors, it was discussed that she can call at any time to schedule an appointment if she feels she needs more support or help. Discussed possibility of counseling. She has benefited from that in the past   - She can follow up in one year for her annual exam and get lab work done prior to this appointment.   - Continue to maintain healthy diet and regular exercise.   - CBC and differential; Future  - Comprehensive metabolic panel; Future  - Hemoglobin A1c; Future  - Lipid Panel (Reflex to Direct  LDL if Triglycerides more than 400); Future  - Vitamin D; Future  - TSH; Future    2. Tinnitus aurium, bilateral  - Recommend referral to audiology for audiogram to ensure the tinnitus she is experiencing is not an early sign of hearing loss, which Diana Bell accepts.   - AMB REFERRAL TO [facility]IOLOGY - [facility]      Follow up with Toni Arthurs, PA  in 1 year, or sooner should concerns arise.     Alysa Mapes 09/27/2022 10:14 AM               The student was personally supervised by me during the patient examination. I personally saw and evaluated the patient, provided the medical decision-making, and reviewed and verified the key elements of the student documentation. I have edited the student's note and confirm the findings and plan of care as documented.     Darliss Ridgel, PA 09/27/22 11:10 AM

## 2022-09-28 ENCOUNTER — Encounter: Payer: Self-pay | Admitting: Gastroenterology

## 2022-10-26 ENCOUNTER — Ambulatory Visit: Payer: BLUE CROSS/BLUE SHIELD | Attending: Internal Medicine

## 2022-10-26 ENCOUNTER — Other Ambulatory Visit: Payer: Self-pay

## 2022-10-26 DIAGNOSIS — H9042 Sensorineural hearing loss, unilateral, left ear, with unrestricted hearing on the contralateral side: Secondary | ICD-10-CM | POA: Insufficient documentation

## 2022-10-26 DIAGNOSIS — H9313 Tinnitus, bilateral: Secondary | ICD-10-CM | POA: Insufficient documentation

## 2022-10-26 NOTE — Progress Notes (Signed)
[facility]  Audiology   [address], [address]  [place],  [place] [address]  Phone: [phone], Fax: [phone]     Outpatient Visit  Patient: Diana Bell   MR Number: [id number]   Date of Birth: Jan 08, 1966   Date of Visit: 10/26/2022      PURE-TONE TEST RESULTS  Type of Testing: conventional      Test Reliability: good  Transducer: headphone, insert (checked with insert)  Booth: 2  ANSI S3.21.2004 (R2009)     Air Conduction Testing (dB HL and kHz)    *checked with insert     LEFT EAR RIGHT EAR     0.125 0.25 0.50  0.75 1.0 1.5 2.0 3.0 4.0 6.0 8.0  0.125 0.25 0.50 0.75 1.0 1.5 2.0 3.0 4.0 6.0 8.0     5* 10   5   0 0 30* 25* '5      10 5   '$ -5   0 10 10 0 10                                                                    Effective Masking Level                                                      Bone Conduction Testing (dB HL and kHz)  Bone conduction was masked when appropriate      0.25 0.50  0.75 1.0 1.5 2.0 3.0 4.0     0.25 0.50 0.75 1.0 1.5 2.0 3.0 4.0      5 5   0   0   25       -5           15                                                                      Effective Masking Level      45                                        SPEECH AUDIOMETRY     SAT SRT Score dB HL EML  Test  SAT SRT Score dB HL EML  Test       100 % 50 30 W-22 CD      100 % 50 30 W-22 CD   EML   EML            EML   EML               Please refer to scanned [facility] [id number] MR for additional information     Notes  Threshold in dB HL  Frequency in kiloHertz (kHz) Legend  dB=decibels  HL=Hearing Level  NR=No Response  VT=Vibro-Tactile EML=Effective Masking Level  SAT=Speech Awareness Threshold  SRT=Speech Reception  Threshold  MLV=Monitored Live Voice  CD=Compact Disk       HISTORY: [name] was seen today for an audiological evaluation.  Patient reports constant bilateral tinnitus that she first noticed at the end of last year.  She does not find the tinnitus to be bothersome at this time.  She  denies concerns for hearing and feels that hearing is equal in both ears.  Dizziness, recent otitis media, otalgia, aural fullness, and ear drainage are denied.     FINDINGS:  Otoscopic examination revealed clear ear canals, bilaterally.  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  Slight asymmetry is noted from 4000-6000 Hz, left ear worse.  Speech recognition ability in quiet was excellent, bilaterally, when speech was presented at an average conversational level.    Results were reviewed with the patient.  She was briefly counseled regarding the nature of tinnitus and its management and rehabilitation strategies (e.g. relaxation and masking techniques).  In regards to the slight asymmetry (left ear worse) in absence of comorbid otologic symptoms, patient may wish to consider ENT consult if desired or audiologic re-evaluation in 1 year or sooner if changes are suspected.    RECOMMENDATIONS: 1) Audiological re-evaluation in one year, or sooner with a change in hearing and/or tinnitus. 2) In regards to the slight asymmetry (left ear worse) in absence of comorbid otologic symptoms, may wish to consider ENT consult if desired or audiologic re-evaluation in 1 year or sooner if changes are suspected.    Jewel Baize, Au.D., [facility]

## 2022-12-31 NOTE — Progress Notes (Signed)
[facility] historical records have been scanned into chart.

## 2023-01-29 ENCOUNTER — Other Ambulatory Visit: Payer: Self-pay | Admitting: Gastroenterology

## 2023-06-24 ENCOUNTER — Encounter: Payer: Self-pay | Admitting: Gastroenterology

## 2023-09-12 ENCOUNTER — Encounter: Payer: Self-pay | Admitting: Gastroenterology

## 2023-10-04 ENCOUNTER — Encounter: Payer: BLUE CROSS/BLUE SHIELD | Admitting: Internal Medicine

## 2023-10-26 ENCOUNTER — Other Ambulatory Visit
Admission: RE | Admit: 2023-10-26 | Discharge: 2023-10-26 | Disposition: A | Discharge status: Home / Self Care | Source: Ambulatory Visit | Attending: Internal Medicine | Admitting: Internal Medicine

## 2023-10-26 DIAGNOSIS — Z0000 Encounter for general adult medical examination without abnormal findings: Secondary | ICD-10-CM | POA: Insufficient documentation

## 2023-10-26 LAB — COMPREHENSIVE METABOLIC PANEL
ALT: 20 U/L (ref 0–35)
AST: 23 U/L (ref 0–35)
Albumin: 4.4 g/dL (ref 3.5–5.2)
Alk Phos: 94 U/L (ref 35–105)
Anion Gap: 11 (ref 7–16)
Bilirubin,Total: 0.2 mg/dL (ref 0.0–1.2)
CO2: 27 mmol/L (ref 20–28)
Calcium: 10 mg/dL (ref 8.6–10.2)
Chloride: 103 mmol/L (ref 96–108)
Creatinine: 0.84 mg/dL (ref 0.51–0.95)
Glucose: 90 mg/dL (ref 60–99)
Lab: 20 mg/dL (ref 6–20)
Potassium: 4.9 mmol/L (ref 3.3–5.1)
Sodium: 141 mmol/L (ref 133–145)
Total Protein: 6.7 g/dL (ref 6.3–7.7)
eGFR BY CREAT: 80 *

## 2023-10-26 LAB — LIPID PANEL
Chol/HDL Ratio: 2.3
Cholesterol: 192 mg/dL
HDL: 84 mg/dL — ABNORMAL HIGH (ref 40–60)
LDL Calculated: 100 mg/dL
Non HDL Cholesterol: 108 mg/dL
Triglycerides: 39 mg/dL

## 2023-10-26 LAB — CBC AND DIFFERENTIAL
Baso # K/uL: 0 10*3/uL (ref 0.0–0.2)
Eos # K/uL: 0.1 10*3/uL (ref 0.0–0.5)
Hematocrit: 46 % (ref 34–49)
Hemoglobin: 13.8 g/dL (ref 11.2–16.0)
IMM Granulocytes #: 0 10*3/uL (ref 0.0–0.0)
IMM Granulocytes: 0.2 %
Lymph # K/uL: 1.7 10*3/uL (ref 1.0–5.0)
MCV: 90 fL (ref 75–100)
Mono # K/uL: 0.4 10*3/uL (ref 0.1–1.0)
Neut # K/uL: 2.4 10*3/uL (ref 1.5–6.5)
Nucl RBC # K/uL: 0 10*3/uL (ref 0.0–0.0)
Nucl RBC %: 0 /100{WBCs} (ref 0.0–0.2)
Platelets: 259 10*3/uL (ref 150–450)
RBC: 5 MIL/uL (ref 4.0–5.5)
RDW: 12.7 % (ref 0.0–15.0)
Seg Neut %: 52.3 %
WBC: 4.6 10*3/uL (ref 3.5–11.0)

## 2023-10-26 LAB — VITAMIN D: 25-OH Vit Total: 49 ng/mL (ref 30–60)

## 2023-10-26 LAB — TSH: TSH: 2.1 u[IU]/mL (ref 0.27–4.20)

## 2023-10-27 ENCOUNTER — Other Ambulatory Visit: Payer: Self-pay

## 2023-10-27 LAB — HEMOGLOBIN A1C: Hemoglobin A1C: 5.8 % — ABNORMAL HIGH

## 2023-10-28 ENCOUNTER — Encounter: Payer: Self-pay | Admitting: Internal Medicine

## 2023-10-28 ENCOUNTER — Ambulatory Visit

## 2023-10-28 ENCOUNTER — Other Ambulatory Visit: Payer: Self-pay

## 2023-10-28 ENCOUNTER — Ambulatory Visit: Payer: BLUE CROSS/BLUE SHIELD | Attending: Primary Care | Admitting: Internal Medicine

## 2023-10-28 VITALS — BP 90/60 | HR 62 | Temp 97.7°F | Resp 20 | Ht 65.5 in | Wt 142.6 lb

## 2023-10-28 DIAGNOSIS — H9042 Sensorineural hearing loss, unilateral, left ear, with unrestricted hearing on the contralateral side: Secondary | ICD-10-CM

## 2023-10-28 DIAGNOSIS — Z1509 Genetic susceptibility to other malignant neoplasm: Secondary | ICD-10-CM | POA: Insufficient documentation

## 2023-10-28 DIAGNOSIS — R7309 Other abnormal glucose: Secondary | ICD-10-CM | POA: Insufficient documentation

## 2023-10-28 DIAGNOSIS — Z0000 Encounter for general adult medical examination without abnormal findings: Secondary | ICD-10-CM | POA: Insufficient documentation

## 2023-10-28 DIAGNOSIS — Z1501 Genetic susceptibility to malignant neoplasm of breast: Secondary | ICD-10-CM | POA: Insufficient documentation

## 2023-10-28 NOTE — H&P (Signed)
 [facility]  History and Physical Examination    HPI:    Diana Bell is a 58 y.o. female here for a complete physical exam      History of Present Illness  The patient is a 58 year old female who presents today for a physical exam.    She expresses concern over her elevated A1c levels, attributing it to a lack of exercise due to inclement weather conditions. She reports a slight weight gain and acknowledges the need for increased physical activity. Despite these concerns, she reports feeling well overall, with no pain or discomfort. She has resumed running after a two-month hiatus and has been practicing Pilates for approximately one year. She maintains regular eye check-ups and reports no issues with her vision. She reports no headaches, dizziness, numbness, tingling, weakness, nosebleeds, snoring, chest pain, shortness of breath, palpitations, cough, wheezing, skin concerns, back pain, abdominal pain, nausea, vomiting, reflux, or urinary issues. She reports no symptoms of depression or anxiety but admits to experiencing normal work-related stress. SShe has recovered from a previous ankle sprain and knee tendinitis, which she believes was due to overexertion from squats.    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 She is considering prophylactic oophorectomy, which may be scheduled for May 2025. She continues to be sexually active and is uncertain about the necessity of a hysterectomy at this point. She did not have a Pap smear during her recent gynecological visit but underwent an internal examination. She undergoes annual mammograms, ultrasounds, and MRIs.    She has a hearing test scheduled for this afternoon. 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    Continues to commute back and forth to [place] for work. PT program is now fully up and  running.     Family is doing well. Mom is doing better in Assisted Living. Children are doing well. One child is transgender/nonbinary born female the other is female. Both of her children are aware of the genetic mutation. She needs to discuss with her brother/nieces next.         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    ALLERGIES:  Environmental allergies, No known drug allergy, and No known latex allergy   Allergies reviewed with patient and confirmed today.    SCREENING  No questionnaires on file.    HISTORY   Past Medical History:   Diagnosis Date    Allergic rhinitis     Elevated LFTs     Raynaud disease      No past surgical history on file.  Family History   Problem Relation Age of Onset    Heart Disease Mother     Hypertension Mother     Anesthesia problems Mother     Anxiety disorder Mother     Depression Mother     Cancer Father     Colon polyps Father     No Known Problems Brother     Colon cancer Paternal Grandmother         64's  No Known Problems Daughter     No Known Problems Daughter     Developmental Delay Sister     Diverticulitis Sister     Malignant Hypertension Neg Hx     Hypotension Neg Hx     Malignant Hyperthermia Neg Hx     Pseudochol deficiency Neg Hx     Celiac disease Neg Hx     Cirrhosis Neg Hx     Crohn's disease Neg Hx     Cystic fibrosis Neg Hx     Esophageal cancer Neg Hx     Hemochromatosis Neg Hx     Inflammatory bowel disease Neg Hx     Irritable bowel syndrome Neg Hx     Liver cancer Neg Hx     Liver Disease Neg Hx     Pancreatic Cancer Neg Hx     Rectal cancer Neg Hx     Stomach cancer Neg Hx     Ulcerative colitis Neg Hx     Wilson's disease Neg Hx      Social History     Social  History Narrative    Not on file          Patient is a non smoker     Metabolic Profile CBC   Lab Results   Component Value Date    NA 141 10/26/2023    K 4.9 10/26/2023    CL 103 10/26/2023    CO2 27 10/26/2023    UN 20 10/26/2023    CREAT 0.84 10/26/2023    GLU 90 10/26/2023    Lab Results   Component Value Date    WBC 4.6 10/26/2023    HGB 13.8 10/26/2023    HCT 46 10/26/2023    PLT 259 10/26/2023      LIPIDS DIABETES   Lab Results   Component Value Date    CHOL 192 10/26/2023    TRIG 39 10/26/2023    HDL 84 (H) 10/26/2023    LDLC 100 10/26/2023    LDLC 84 09/21/2022    LDLC 88 09/19/2021    CHHDC 2.3 10/26/2023    Lab Results   Component Value Date    GLU 90 10/26/2023     Hemoglobin A1C   Date Value Ref Range Status   10/26/2023 5.8 (H) % Final     Comment:     Ref Range <=5.6  HbA1c values of 5.7-6.4% indicate an increased risk for developing  diabetes mellitus.  HbA1c values greater than or equal to 6.5% are diagnostic of  diabetes mellitus.  For diagnosis of diabetes in individuals without unequivocal  hyperglycemia, results should be confirmed by repeat testing.     09/21/2022 5.5 % Final     Comment:     Ref Range <=5.6  HbA1c values of 5.7-6.4% indicate an increased risk for developing  diabetes mellitus.  HbA1c values greater than or equal to 6.5% are diagnostic of  diabetes mellitus.  For diagnosis of diabetes in individuals without unequivocal  hyperglycemia, results should be confirmed by repeat testing.     09/19/2021 5.7 (H) % Final     Comment:     Ref Range <=5.6  HbA1c values of 5.7-6.4% indicate an increased risk for developing  diabetes mellitus.  HbA1c values greater than or equal to 6.5% are diagnostic of  diabetes mellitus.  For diagnosis of diabetes in individuals without unequivocal  hyperglycemia, results should be confirmed by repeat testing.     09/29/2020 5.8 (H) %  Final     Comment:     Ref Range <=5.6  HbA1c values of 5.7-6.4% indicate an increased risk for developing  diabetes  mellitus.  HbA1c values greater than or equal to 6.5% are diagnostic of  diabetes mellitus.  For diagnosis of diabetes in individuals without unequivocal  hyperglycemia, results should be confirmed by repeat testing.     09/15/2019 5.7 (H) % Final     Comment:     Ref Range <=5.6  HbA1c values of 5.7-6.4% indicate an increased risk for developing  diabetes mellitus.  HbA1c values greater than or equal to 6.5% are diagnostic of  diabetes mellitus.  For diagnosis of diabetes in individuals without unequivocal  hyperglycemia, results should be confirmed by repeat testing.          LIVER MISC   Lab Results   Component Value Date    AST 23 10/26/2023    ALT 20 10/26/2023    Lab Results   Component Value Date    TSH 2.10 10/26/2023    VID25 49 10/26/2023        Immunization History   Administered Date(s) Administered    COVID-19 MRNA Bivalent Vaccine Moderna 50 mcg/0.75mL or 25 mcg/0.50mL IM SUSP 05/03/2021    COVID-19 mRNA Vaccine (Moderna Spikevax) 12 Years+ 05/24/2022    COVID-19 mRNA Vaccine [employer] Comirnaty) 12 Years+ 04/28/2023    Covid-19 mRNA vaccine (PFIZER) IM 30 mcg/0.50mL 08/24/2019, 09/14/2019, 06/05/2020    Hepatitis A Adult 10/23/2011    Influenza Adult(24yr and up) 08/07/2012, 05/10/2015    Influenza Cell-based Trivalent PF Vaccine, 77mo-64y 04/28/2023    Influenza Quad 0.30mL prefilled syringe/single dose vial (FluLaval,Fluzone,Afluria,Fluarix)Historical 05/26/2019, 05/29/2020, 05/24/2022    Influenza Quad Cell-Based prefilled syringe (Flucelvax) 75mo+ Historical 05/03/2021    Influenza multi-dose vial historical 05/07/2013, 05/26/2019    Influenza, split virus, trivalent, PF 05/07/2017, 05/06/2018    MMR 03/27/2006    PPD Test 04/09/2012    Td 03/20/2006    Tdap 09/25/2021    Varicella 08/20/1994, 03/29/2006    Zoster(Shingrix) 03/17/2019, 10/16/2019        Health Maintenance Topics with due status: Overdue       Topic Date Due    IMM Pneumo: 50+ Years Never done     Health Maintenance Topics with due status:  Postponed       Topic Postponed Until    HIV Screening [place] 08/20/2097 (Originally 07/30/1979)     Health Maintenance Topics with due status: Not Due       Topic Last Completion Date    Colon Cancer Screening Other 03/26/2016    Cervical Cancer Screening (USPSTF/ACOG) 07/13/2019    IMM DTaP/Tdap/Td 09/25/2021    Breast Cancer Screening Other 01/29/2023    Depression Screen Yearly 10/28/2023     Health Maintenance Topics with due status: Completed       Topic Last Completion Date    Hepatitis C Screening USPSTF/[place] 07/25/2010    IMM-Zoster 10/16/2019    IMM-Influenza 04/28/2023    COVID-19 Vaccine 04/28/2023     Health Maintenance Topics with due status: Aged [employer] Date Due    IMM-HIB 0-5 Yrs or At-Risk Patients Aged Out    IMM-HPV 9-26 Yrs or Shared Decision (27-45 Yrs) Aged Out    IMM-MCV4 0-18 Yrs or At-Risk Patients Aged Out    IMM-Rotavirus 0-8 Months Aged Out    IMM-MenB (2 Plans: Shared decision & Increased Risk Plans) Aged Out  EXAMINATION:  Vitals:    10/28/23 1404   BP: 90/60   Pulse: 62   Resp: 20   Temp: 36.5 C (97.7 F)   SpO2: 98%   Weight: 64.7 kg (142 lb 9.6 oz)   Height: 1.664 m (5' 5.5")                                 Body mass index is 23.37 kg/m.  BP Readings from Last 4 Encounters:   10/28/23 90/60   09/27/22 100/62   03/29/22 103/57   09/25/21 90/60      Wt Readings from Last 4 Encounters:   10/28/23 64.7 kg (142 lb 9.6 oz)   09/27/22 62.1 kg (137 lb)   03/29/22 59 kg (130 lb)   09/25/21 60.3 kg (133 lb)         General Appearance:    Alert, cooperative, well nourished, non toxic, no acute distress.   Head:    Normocephalic, Atraumatic   Eyes:    Conjunctiva clear and without pallor, sclerae anicteric. PERRLA, EOMI.   Ears:   Bilateral TMs are clear without erythema or effusion.  Canals without erythema or edema. There is scant cerumen in the right ear that was removed under direct visualization with a lighted curette.     Oropharynx:   Buccal mucosa normal; good  dentition; oropharynx clear   Neck:   Supple, no thyromegaly or tenderness.   Lungs:     Respiratory rate is normal. No respiratory distress noted. Clear to auscultation bilaterally without unlabored respiratory effort    Heart:    Regular rate and rhythm, S1 and S2 normal, no murmur, rub or gallop   Abdomen:     Soft, non-tender, no masses, no hepatosplenomegaly or hernias.    Extremities:   Warm and well perfused, legs without edema.    Musculoskeletal:   Normal movement and range of motion in all for extremities.    Skin:   Warm, dry, no rashes or lesions noted in visible areas   Lymph nodes:   No cervical or supraclavicular lymphadenopathy   Neurologic:   Alert and oriented to person, place and time. Patellar reflexes 2+ and symmetric. Normal, steady gait.    Psychiatric:   Good eye contact. Appropriate mood and affect     ASSESSMENT/PLAN:  Diana Bell was seen today for a physical examination. Findings as detailed below:     Assessment & Plan  Routine general medical examination   Her overall health status is commendable, with all parameters within normal limits, except for a slight elevation in her A1c levels. This could be attributed to her genetic predisposition and recent decreased cardiovascular exercise. She has been informed that she is not at an increased risk for pneumonia, despite the system flagging her for a pneumonia vaccine due to her elevated A1c levels. She has been advised to resume her regular exercise regimen, which should help stabilize her A1c levels. A blood work order has been placed for her to complete in a year.    Elevated A1c.  Her A1c levels have increased, likely due to a decrease in physical activity and a slight weight gain. She has been advised to resume her regular exercise routine to help stabilize her A1c levels.    RAD51C gene positive   Genetic predisposition to breast and ovarian cancer.  She tested positive for the RAD51C gene, which increases her risk for breast  and  ovarian cancer. She has discussed the option of oophorectomy versus hysterectomy with her gynecologist and has decided to proceed with the oophorectomy, which is scheduled for May 2025.    Follow-up  The patient will follow up in 1 year for a physical exam.        Darliss Ridgel, PA 10/28/2023 2:55 PM          [1]   Current Outpatient Medications:     Ascorbic Acid (VITAMIN C PO), Take by mouth., Disp: , Rfl:     estradiol (ESTRACE) 0.1 MG/GM vaginal cream, Place 1 g vaginally three times a week., Disp: , Rfl:     cholecalciferol, Vitamin D3, (CHOLECALCIFEROL) 50 mcg (2,000 unit) tablet, , Disp: , Rfl:     calcium carbonate-vitamin D 600-400 MG-UNIT per tablet, Take 1 tablet by mouth., Disp: , Rfl:     Evening Primrose Oil 500 MG CAPS, Take 500 mg by mouth daily, Disp: , Rfl:     magnesium oxide (MAG-OX) 400 (241.3 MG) MG tablet, Take 1 tablet (400 mg total) by mouth daily., Disp: , Rfl:     fluticasone (FLONASE) 50 MCG/ACT nasal spray, Spray 1 spray into nostril daily., Disp: , Rfl:

## 2023-10-28 NOTE — Progress Notes (Signed)
 AUDIOLOGIC EVALUATION     Audiology  Part of [facility]   [address] [place], [address]  [place],  [place] [address]  Phone: [phone], Fax: [phone]     Outpatient Visit  Patient: Diana Bell   MR Number: [id number]   Date of Birth: 03/17/1966   Date of Visit: 10/28/2023      PURE-TONE TEST RESULTS  Type of Testing: conventional      Test Reliability: good  Transducer: headphone  Booth: 3  ANSI S3.21.2004 (R2009)     Air Conduction Testing (dB HL and kHz)     LEFT EAR RIGHT EAR     0.125 0.25 0.50  0.75 1.0 1.5 2.0 3.0 4.0 6.0 8.0  0.125 0.25 0.50 0.75 1.0 1.5 2.0 3.0 4.0 6.0 8.0     15 15   5    0 0 30 25 5      15 15   5   5 10 15 5 10                                                                     Effective Masking Level                                                      Bone Conduction Testing (dB HL and kHz)  Bone conduction was not tested-known sensorineural hearing loss      0.25 0.50  0.75 1.0 1.5 2.0 3.0 4.0     0.25 0.50 0.75 1.0 1.5 2.0 3.0 4.0            Known  SNHL                                                                                                       SPEECH AUDIOMETRY     SAT SRT Score dB HL EML  Test  SAT SRT Score dB HL EML  Test       96 % 55 25 W-22 CD      96 % 55 25 W-22 CD   EML   EML            EML   EML               Please refer to scanned [facility] [id number] MR for additional information     Notes  Threshold in dB HL  Frequency in kiloHertz (kHz) Legend   dB=decibels  HL=Hearing Level  NR=No Response  VT=Vibro-Tactile EML=Effective Masking Level  SAT=Speech Awareness Threshold  SRT=Speech Reception  Threshold  MLV=Monitored Live Voice  CD=Compact Disk     HISTORY: Diana Bell was seen for an  audiological evaluation and is followed medically by Toni Arthurs, PA.  Patient's hearing is being monitored due to a history of unilateral hearing loss.  The previous audiogram dated 10/26/2022 shows normal hearing sensitivity with the  exception of a mild/slight sensorineural hearing loss 4000-6000 Hz in the left ear, and normal hearing sensitivity in the right ear, with an asymmetry noted from 4000-6000 Hz, left ear worse.  Today, patient denies noticing any change in hearing since her last evaluation.  She also reports no change in her bilateral, non-bothersome tinnitus.  Otalgia, aural pressure, dizziness/vertigo, and a history of noise exposure are all denied.     FINDINGS: Pure-tone test results indicate normal hearing sensitivity with the exception of a mild/slight known to be sensorineural hearing loss 4000-6000 Hz in the left ear, and normal hearing sensitivity in the right ear.  Thresholds are essentially unchanged when compared to the previous audiogram dated 10/26/2022.  An asymmetry persists, with thresholds in the left ear 15-20 dB worse from 4000-6000 Hz when compared to the right ear.  Speech recognition ability in quiet was excellent, bilaterally, when speech was presented at an average conversational level.     Today's test results were reviewed with the patient.      RECOMMENDATIONS: Audiologic re-evaluation in 1-2 year or sooner with any changes in hearing sensitivity and/or tinnitus.    Oda Cogan, AuD  Audiologist  [facility]

## 2024-01-23 ENCOUNTER — Encounter: Payer: Self-pay | Admitting: Internal Medicine

## 2024-01-23 NOTE — Telephone Encounter (Signed)
 pATIENT COULDN'T MAKE THAT DATE AND IS GOING TO GO TO uRGENT CARE

## 2024-01-23 NOTE — Telephone Encounter (Signed)
 Called Patient to schedule and appointment from Mychart message. Left a message to call back.

## 2024-01-27 ENCOUNTER — Encounter: Payer: Self-pay | Admitting: Gastroenterology

## 2024-01-31 ENCOUNTER — Ambulatory Visit: Admitting: Internal Medicine

## 2024-02-05 ENCOUNTER — Other Ambulatory Visit: Payer: Self-pay | Admitting: Gastroenterology

## 2024-03-23 ENCOUNTER — Other Ambulatory Visit: Payer: Self-pay | Admitting: Gastroenterology

## 2024-03-26 ENCOUNTER — Encounter: Payer: Self-pay | Admitting: Internal Medicine

## 2024-11-16 ENCOUNTER — Encounter: Admitting: Internal Medicine

## 2024-11-23 ENCOUNTER — Encounter: Admitting: Internal Medicine

## 7866-05-20 DEATH — deceased
